# Patient Record
Sex: Female | Born: 1985 | Race: White | Hispanic: No | Smoking: Former smoker
Health system: Southern US, Community
[De-identification: ages and names within clinical notes are randomized; demographics above are authoritative.]

## PROBLEM LIST (undated history)

## (undated) DIAGNOSIS — Z800 Family history of malignant neoplasm of digestive organs: Secondary | ICD-10-CM

## (undated) DIAGNOSIS — Z803 Family history of malignant neoplasm of breast: Secondary | ICD-10-CM

## (undated) HISTORY — DX: Family history of malignant neoplasm of digestive organs: Z80.0

## (undated) HISTORY — DX: Family history of malignant neoplasm of breast: Z80.3

---

## 2011-08-05 HISTORY — PX: TUBAL LIGATION: SHX77

## 2013-12-02 ENCOUNTER — Emergency Department (HOSPITAL_COMMUNITY)
Admission: EM | Admit: 2013-12-02 | Discharge: 2013-12-02 | Disposition: A | Discharge status: Home / Self Care | Payer: Medicaid Other | Attending: Emergency Medicine | Admitting: Emergency Medicine

## 2013-12-02 ENCOUNTER — Encounter (HOSPITAL_COMMUNITY): Payer: Self-pay | Admitting: Emergency Medicine

## 2013-12-02 DIAGNOSIS — Z880 Allergy status to penicillin: Secondary | ICD-10-CM | POA: Insufficient documentation

## 2013-12-02 DIAGNOSIS — Z3202 Encounter for pregnancy test, result negative: Secondary | ICD-10-CM | POA: Insufficient documentation

## 2013-12-02 DIAGNOSIS — N390 Urinary tract infection, site not specified: Secondary | ICD-10-CM

## 2013-12-02 DIAGNOSIS — Z87891 Personal history of nicotine dependence: Secondary | ICD-10-CM | POA: Insufficient documentation

## 2013-12-02 DIAGNOSIS — Z79899 Other long term (current) drug therapy: Secondary | ICD-10-CM | POA: Insufficient documentation

## 2013-12-02 LAB — URINALYSIS, ROUTINE W REFLEX MICROSCOPIC
BILIRUBIN URINE: NEGATIVE
GLUCOSE, UA: NEGATIVE mg/dL
Ketones, ur: NEGATIVE mg/dL
Nitrite: NEGATIVE
Protein, ur: 100 mg/dL — AB
Specific Gravity, Urine: 1.021 (ref 1.005–1.030)
Urobilinogen, UA: 0.2 mg/dL (ref 0.0–1.0)
pH: 6 (ref 5.0–8.0)

## 2013-12-02 LAB — POC URINE PREG, ED: PREG TEST UR: NEGATIVE

## 2013-12-02 LAB — URINE MICROSCOPIC-ADD ON

## 2013-12-02 MED ORDER — PHENAZOPYRIDINE HCL 200 MG PO TABS: 200.0000 mg | ORAL_TABLET | Freq: Three times a day (TID) | ORAL | Status: DC

## 2013-12-02 MED ORDER — SULFAMETHOXAZOLE-TMP DS 800-160 MG PO TABS
1.0000 | ORAL_TABLET | Freq: Two times a day (BID) | ORAL | Status: DC
  Administered 2013-12-02: 1 via ORAL
  Filled 2013-12-02: qty 1

## 2013-12-02 MED ORDER — PHENAZOPYRIDINE HCL 200 MG PO TABS
200.0000 mg | ORAL_TABLET | Freq: Three times a day (TID) | ORAL | Status: DC
  Administered 2013-12-02: 200 mg via ORAL
  Filled 2013-12-02: qty 1

## 2013-12-02 MED ORDER — SULFAMETHOXAZOLE-TMP DS 800-160 MG PO TABS: 1.0000 | ORAL_TABLET | Freq: Two times a day (BID) | ORAL | Status: DC

## 2013-12-02 NOTE — Discharge Instructions (Signed)
Urinary Tract Infection  Urinary tract infections (UTIs) can develop anywhere along your urinary tract. Your urinary tract is your body's drainage system for removing wastes and extra water. Your urinary tract includes two kidneys, two ureters, a bladder, and a urethra. Your kidneys are a pair of bean-shaped organs. Each kidney is about the size of your fist. They are located below your ribs, one on each side of your spine.  CAUSES  Infections are caused by microbes, which are microscopic organisms, including fungi, viruses, and bacteria. These organisms are so small that they can only be seen through a microscope. Bacteria are the microbes that most commonly cause UTIs.  SYMPTOMS   Symptoms of UTIs may vary by age and gender of the patient and by the location of the infection. Symptoms in young women typically include a frequent and intense urge to urinate and a painful, burning feeling in the bladder or urethra during urination. Older women and men are more likely to be tired, shaky, and weak and have muscle aches and abdominal pain. A fever may mean the infection is in your kidneys. Other symptoms of a kidney infection include pain in your back or sides below the ribs, nausea, and vomiting.  DIAGNOSIS  To diagnose a UTI, your caregiver will ask you about your symptoms. Your caregiver also will ask to provide a urine sample. The urine sample will be tested for bacteria and white blood cells. White blood cells are made by your body to help fight infection.  TREATMENT   Typically, UTIs can be treated with medication. Because most UTIs are caused by a bacterial infection, they usually can be treated with the use of antibiotics. The choice of antibiotic and length of treatment depend on your symptoms and the type of bacteria causing your infection.  HOME CARE INSTRUCTIONS   If you were prescribed antibiotics, take them exactly as your caregiver instructs you. Finish the medication even if you feel better after you  have only taken some of the medication.   Drink enough water and fluids to keep your urine clear or pale yellow.   Avoid caffeine, tea, and carbonated beverages. They tend to irritate your bladder.   Empty your bladder often. Avoid holding urine for long periods of time.   Empty your bladder before and after sexual intercourse.   After a bowel movement, women should cleanse from front to back. Use each tissue only once.  SEEK MEDICAL CARE IF:    You have back pain.   You develop a fever.   Your symptoms do not begin to resolve within 3 days.  SEEK IMMEDIATE MEDICAL CARE IF:    You have severe back pain or lower abdominal pain.   You develop chills.   You have nausea or vomiting.   You have continued burning or discomfort with urination.  MAKE SURE YOU:    Understand these instructions.   Will watch your condition.   Will get help right away if you are not doing well or get worse.  Document Released: 04/30/2005 Document Revised: 01/20/2012 Document Reviewed: 08/29/2011  [employer] Patient Information 2014 [employer].

## 2013-12-02 NOTE — Progress Notes (Signed)
[facility] provided pt with a list of primary care resources and a [facility] [employer] Card application to help patient establish primary care.  °

## 2013-12-02 NOTE — ED Notes (Signed)
Pt reports to ed with c/o  bilateral lower abdominal pain since yesterday morning.Pt also reports urinary frequncy and pain with urination.

## 2013-12-02 NOTE — ED Provider Notes (Signed)
CSN: [id number]     Arrival date & time 12/02/13  1508 History   First MD Initiated Contact with Patient 12/02/13 1549     Chief Complaint  Patient presents with  . Abdominal Pain   Patient is a 28 y.o. female presenting with dysuria.  Dysuria Pain quality:  Sharp Pain severity:  Moderate Duration:  1 day Timing:  Constant Progression:  Worsening Chronicity:  New Relieved by:  Nothing Exacerbated by: urinating. Urinary symptoms: discolored urine, frequent urination and hematuria   Associated symptoms: abdominal pain   Associated symptoms: no fever, no genital lesions, no vaginal discharge and no vomiting   Risk factors: no hx of pyelonephritis   Pt states there is a family history of kidney stones in several family members.  History reviewed. No pertinent past medical history. History reviewed. No pertinent past surgical history. No family history on file. History  Substance Use Topics  . Smoking status: Former Smoker    Quit date: 08/04/2008  . Smokeless tobacco: Not on file  . Alcohol Use: Yes     Comment: occ.   OB History   Grav Para Term Preterm Abortions TAB SAB Ect Mult Living                 Review of Systems  Constitutional: Negative for fever.  Gastrointestinal: Positive for abdominal pain. Negative for vomiting.  Genitourinary: Positive for dysuria. Negative for vaginal discharge.  All other systems reviewed and are negative.     Allergies  Penicillins  Home Medications   Prior to Admission medications   Medication Sig Start Date End Date Taking? Authorizing Provider  calcium gluconate 500 MG tablet Take 1 tablet by mouth daily.   Yes Historical Provider, MD  omega-3 acid ethyl esters (LOVAZA) 1 G capsule Take 1 g by mouth daily.   Yes Historical Provider, MD   BP 129/76  Pulse 89  Temp(Src) 97.6 F (36.4 C) (Oral)  Resp 18  SpO2 100%  LMP 11/18/2013 Physical Exam  Nursing note and vitals reviewed. Constitutional: She appears well-developed  and well-nourished. No distress.  HENT:  Head: Normocephalic and atraumatic.  Right Ear: External ear normal.  Left Ear: External ear normal.  Eyes: Conjunctivae are normal. Right eye exhibits no discharge. Left eye exhibits no discharge. No scleral icterus.  Neck: Neck supple. No tracheal deviation present.  Cardiovascular: Normal rate, regular rhythm and intact distal pulses.   Pulmonary/Chest: Effort normal and breath sounds normal. No stridor. No respiratory distress. She has no wheezes. She has no rales.  Abdominal: Soft. Bowel sounds are normal. She exhibits no distension. There is no tenderness. There is no rebound and no guarding.  Musculoskeletal: She exhibits no edema and no tenderness.  Neurological: She is alert. She has normal strength. No cranial nerve deficit (no facial droop, extraocular movements intact, no slurred speech) or sensory deficit. She exhibits normal muscle tone. She displays no seizure activity. Coordination normal.  Skin: Skin is warm and dry. No rash noted.  Psychiatric: She has a normal mood and affect.    ED Course  Procedures (including critical care time) Labs Review Labs Reviewed  URINALYSIS, ROUTINE W REFLEX MICROSCOPIC - Abnormal; Notable for the following:    APPearance TURBID (*)    Hgb urine dipstick LARGE (*)    Protein, ur 100 (*)    Leukocytes, UA LARGE (*)    All other components within normal limits  URINE CULTURE  URINE MICROSCOPIC-ADD ON  POC URINE PREG, ED  MDM   Final diagnoses:  UTI (urinary tract infection)    Simple uti.  Will dc home with pyridium and bactrim.  No fever.  No signs of pyelo.  Will send off urine culture    Celene KrasJon R Jaidy Cottam, MD 12/02/13 1725

## 2013-12-03 LAB — URINE CULTURE

## 2014-02-18 ENCOUNTER — Emergency Department (HOSPITAL_COMMUNITY)
Admission: EM | Admit: 2014-02-18 | Discharge: 2014-02-19 | Disposition: A | Discharge status: Home / Self Care | Payer: Medicaid Other | Attending: Emergency Medicine | Admitting: Emergency Medicine

## 2014-02-18 ENCOUNTER — Encounter (HOSPITAL_COMMUNITY): Payer: Self-pay | Admitting: Emergency Medicine

## 2014-02-18 DIAGNOSIS — Y9289 Other specified places as the place of occurrence of the external cause: Secondary | ICD-10-CM | POA: Insufficient documentation

## 2014-02-18 DIAGNOSIS — Z87891 Personal history of nicotine dependence: Secondary | ICD-10-CM | POA: Insufficient documentation

## 2014-02-18 DIAGNOSIS — T1590XA Foreign body on external eye, part unspecified, unspecified eye, initial encounter: Secondary | ICD-10-CM | POA: Diagnosis not present

## 2014-02-18 DIAGNOSIS — Z880 Allergy status to penicillin: Secondary | ICD-10-CM | POA: Diagnosis not present

## 2014-02-18 DIAGNOSIS — Y9389 Activity, other specified: Secondary | ICD-10-CM | POA: Diagnosis not present

## 2014-02-18 DIAGNOSIS — T1592XA Foreign body on external eye, part unspecified, left eye, initial encounter: Secondary | ICD-10-CM

## 2014-02-18 MED ORDER — ERYTHROMYCIN 5 MG/GM OP OINT
TOPICAL_OINTMENT | Freq: Once | OPHTHALMIC | Status: DC
  Filled 2014-02-18: qty 3.5

## 2014-02-18 MED ORDER — TETRACAINE HCL 0.5 % OP SOLN: 2.0000 [drp] | Freq: Once | OPHTHALMIC | Status: DC

## 2014-02-18 NOTE — ED Notes (Signed)
Pt arrives via EMS with c/o of superglue in L eye from doing fingernails. States it was scraping her eye when she looked around, no redness, no leakage, not visible to EMS.

## 2014-02-19 MED ORDER — ERYTHROMYCIN 5 MG/GM OP OINT
1.0000 "application " | TOPICAL_OINTMENT | Freq: Once | OPHTHALMIC | Status: AC
  Administered 2014-02-19: 1 via OPHTHALMIC

## 2014-02-19 NOTE — ED Notes (Signed)
EDP at bedside  

## 2014-02-19 NOTE — ED Provider Notes (Signed)
CSN: [id number]     Arrival date & time 02/18/14  2305 History   First MD Initiated Contact with Patient 02/18/14 2346     Chief Complaint  Patient presents with  . Foreign Body in Eye     (Consider location/radiation/quality/duration/timing/severity/associated sxs/prior Treatment) HPI Comments: The patient presents by ambulance after stating that she got glue in her left eye. She was applying acrylic nails, decided that she wanted a different length and when she popped off one of her nails fresh adhesive flew into her left eye. This caused acute onset of mild discomfort and a feeling of a foreign body sensation. She denies any discharge redness watering or pain and has no change in vision. She states that she feels as though she has something under her eyelid. This occurred just prior to arrival, acute in onset, persistent, mild  Patient is a 28 y.o. female presenting with foreign body in eye. The history is provided by the patient.  Foreign Body in Eye    History reviewed. No pertinent past medical history. Past Surgical History  Procedure Laterality Date  . Cesarean section     No family history on file. History  Substance Use Topics  . Smoking status: Former Smoker    Quit date: 08/04/2008  . Smokeless tobacco: Not on file  . Alcohol Use: Yes     Comment: occ.   OB History   Grav Para Term Preterm Abortions TAB SAB Ect Mult Living                 Review of Systems  Eyes: Positive for pain. Negative for photophobia, discharge, redness and visual disturbance.  Gastrointestinal: Negative for nausea and vomiting.      Allergies  Penicillins  Home Medications   Prior to Admission medications   Not on File   BP 106/53  Pulse 70  Temp(Src) 98 F (36.7 C) (Oral)  Resp 14  Ht 5\' 6"  (1.676 m)  Wt 170 lb (77.111 kg)  BMI 27.45 kg/m2  SpO2 98%  LMP 02/17/2014 Physical Exam  Constitutional: She appears well-developed and well-nourished. No distress.  HENT:   Head: Normocephalic and atraumatic.  Mouth/Throat: No oropharyngeal exudate.  Eyes: Conjunctivae and EOM are normal. Pupils are equal, round, and reactive to light. Right eye exhibits no discharge. Left eye exhibits no discharge. No scleral icterus.  I examined under tetracaine and fluorescein, Woods lamp, no signs of foreign body, no corneal abrasion, lid everted without signs of foreign body, no crusting discharge or watering  Pulmonary/Chest: Effort normal.  Skin: Skin is warm and dry. No rash noted. No erythema.    ED Course  Procedures (including critical care time) Labs Review Labs Reviewed - No data to display  Imaging Review No results found.    MDM   Final diagnoses:  Foreign body, eye, left, initial encounter    No signs of foreign body or corneal injury on my exam, due to her feeling of foreign body sensation we'll discharge home with erythromycin ointment, instructions to followup with ophthalmology, phone number given, patient expresses understanding to the plan   Meds given in ED:  Medications  tetracaine (PONTOCAINE) 0.5 % ophthalmic solution 2 drop (not administered)  erythromycin ophthalmic ointment 1 application (not administered)    New Prescriptions   No medications on file        Vida RollerBrian D Tashiba Timoney, MD 02/19/14 [phone]

## 2014-02-19 NOTE — Discharge Instructions (Signed)
Followup with the eye doctor on Monday, use the antibiotic ointment every 3 hours until followup. 1/2 inch of ointment, leave it in the eye for 5-10 minutes blinking frequently. If she develops severe or worsening redness, discharge, pain or swelling return to the hospital immediately.

## 2014-03-24 ENCOUNTER — Encounter: Payer: Self-pay | Admitting: Family Medicine

## 2014-03-24 ENCOUNTER — Ambulatory Visit: Payer: Medicaid Other | Attending: Family Medicine | Admitting: Family Medicine

## 2014-03-24 ENCOUNTER — Other Ambulatory Visit (HOSPITAL_COMMUNITY)
Admission: RE | Admit: 2014-03-24 | Discharge: 2014-03-24 | Disposition: A | Discharge status: Home / Self Care | Payer: Medicaid Other | Source: Ambulatory Visit | Attending: Family Medicine | Admitting: Family Medicine

## 2014-03-24 VITALS — BP 105/70 | HR 93 | Temp 98.1°F | Resp 16 | Ht 67.0 in | Wt 184.0 lb

## 2014-03-24 DIAGNOSIS — Z803 Family history of malignant neoplasm of breast: Secondary | ICD-10-CM | POA: Diagnosis not present

## 2014-03-24 DIAGNOSIS — Z809 Family history of malignant neoplasm, unspecified: Secondary | ICD-10-CM

## 2014-03-24 DIAGNOSIS — Z113 Encounter for screening for infections with a predominantly sexual mode of transmission: Secondary | ICD-10-CM

## 2014-03-24 DIAGNOSIS — Z87891 Personal history of nicotine dependence: Secondary | ICD-10-CM | POA: Insufficient documentation

## 2014-03-24 DIAGNOSIS — Z01419 Encounter for gynecological examination (general) (routine) without abnormal findings: Secondary | ICD-10-CM | POA: Diagnosis present

## 2014-03-24 DIAGNOSIS — E663 Overweight: Secondary | ICD-10-CM | POA: Diagnosis not present

## 2014-03-24 DIAGNOSIS — H6123 Impacted cerumen, bilateral: Secondary | ICD-10-CM | POA: Insufficient documentation

## 2014-03-24 DIAGNOSIS — M79609 Pain in unspecified limb: Secondary | ICD-10-CM | POA: Insufficient documentation

## 2014-03-24 DIAGNOSIS — M7989 Other specified soft tissue disorders: Secondary | ICD-10-CM

## 2014-03-24 DIAGNOSIS — Z800 Family history of malignant neoplasm of digestive organs: Secondary | ICD-10-CM | POA: Diagnosis not present

## 2014-03-24 DIAGNOSIS — N760 Acute vaginitis: Secondary | ICD-10-CM | POA: Diagnosis present

## 2014-03-24 DIAGNOSIS — H6120 Impacted cerumen, unspecified ear: Secondary | ICD-10-CM

## 2014-03-24 DIAGNOSIS — Z124 Encounter for screening for malignant neoplasm of cervix: Secondary | ICD-10-CM

## 2014-03-24 LAB — CBC
HCT: 41 % (ref 36.0–46.0)
Hemoglobin: 13.4 g/dL (ref 12.0–15.0)
MCH: 25.9 pg — AB (ref 26.0–34.0)
MCHC: 32.7 g/dL (ref 30.0–36.0)
MCV: 79.3 fL (ref 78.0–100.0)
Platelets: 303 10*3/uL (ref 150–400)
RBC: 5.17 MIL/uL — AB (ref 3.87–5.11)
RDW: 15.4 % (ref 11.5–15.5)
WBC: 5.7 10*3/uL (ref 4.0–10.5)

## 2014-03-24 MED ORDER — CARBAMIDE PEROXIDE 6.5 % OT SOLN: 5.0000 [drp] | Freq: Two times a day (BID) | OTIC | Status: DC

## 2014-03-24 NOTE — Progress Notes (Signed)
   Subjective:    Patient ID: Suzanne Campbell, female    DOB: 12/31/1985, 28 y.o.   MRN: [id number] CC: establish care with pap, family history of cancer, L arm pain and swelling  HPI 28 year old female presents to establish care and discuss the following:  #1 family history of cancer: Patient has a strong family history of multiple cancers including gastric cancer, breast cancer, colon cancer. Her family history of colon cancer notable for colon cancer her mother diagnosed at age 27. Her mother died an early age from complications of colon cancer. Patient has never had a colonoscopy. Patient [place] plan [place] before testing was done. Patient has no personal history of cancer.  #2 left biceps pain with swelling: Patient woke up 4 days ago with pain in her left by separating out her left arm medially and laterally. Patient was also had pain in her left pectoral laterally. The following day the patient noted bruising in her left bicep. She denies left arm weakness. She denies lack of grip strength. Pain is moderate severity. She has had no treatment. The patient has no history of DVT.  #3 healthcare maintenance: Patient is due for Pap smear. Patient essentially active with one partner. She has tubal ligation for contraception. She requests STD testing.   Social history: Patient is a former smoker.  Review of Systems As per history of present illness    Objective:   Physical Exam BP 105/70  Pulse 93  Temp(Src) 98.1 F (36.7 C) (Oral)  Resp 16  Ht 5\' 7"  (1.702 m)  Wt 184 lb (83.462 kg)  BMI 28.81 kg/m2  SpO2 97%  LMP 03/13/2014 General appearance: alert, cooperative and no distress Ears: cerumen in both ears partially occluding canals  Nose: no discharge, turbinates pink, swollen Throat: lips, mucosa, and tongue normal; teeth and gums normal Neck: no adenopathy, supple, symmetrical, trachea midline and thyroid not enlarged, symmetric,  no tenderness/mass/nodules Lungs: clear to auscultation bilaterally Breasts: normal appearance, no masses or tenderness Heart: regular rate and rhythm, S1, S2 normal, no murmur, click, rub or gallop Pelvic: cervix normal in appearance, external genitalia normal, no adnexal masses or tenderness, no cervical motion tenderness, rectovaginal septum normal, uterus normal size, shape, and consistency and vagina normal without discharge Extremities: L arm: tenderness with notable linear bruise along bicep tendon down to lateral forearms. normal muschle tone. normal grip strenght.  no LE edema. Varicose vein R lateral thigh distally        Assessment & Plan:

## 2014-03-24 NOTE — Progress Notes (Signed)
Pt is here to establish care. Pt is here for a physical and pap smear. Pt states that her left arm is in pain and bruised.

## 2014-03-24 NOTE — Assessment & Plan Note (Signed)
A: Pain and swelling left bicep concerning for DVT versus partial biceps tendon rupture. Patient denies history of trauma. Patient denies heavy lifting. There is no evidence of soft tissue infection. P:  Evaluate with Doppler left upper extremity to rule out DVT. Tylenol and ice for pain.

## 2014-03-24 NOTE — Patient Instructions (Signed)
Ms. Carolin CoyRoach,  It was very nice to meet you. I look forward to caring for you and being your primary doctor.  I will be in touch with lab and pap results.  For L arm pain and swelling: please go for ultrasound to rule out blood clot. Please take Tylenol and use ice for pain.  For your history of colon cancer in her mother: I will look up recommendations are for he to GI specialist to discuss colonoscopy recommended.  For your strong family history of multiple cancers as placed referral to a geneticist.  Dr. Armen PickupFunches

## 2014-03-24 NOTE — Assessment & Plan Note (Signed)
A: history concern for genetic predisposition to cancer. Sisters living w/o cancer.  P:  Based on history, patient should have had a colonoscopy at 19.  Plan to send to GI to discuss risk and benefit of screening colonoscopy.

## 2014-03-24 NOTE — Assessment & Plan Note (Signed)
Screening HIV  

## 2014-03-25 LAB — COMPREHENSIVE METABOLIC PANEL
ALBUMIN: 4.8 g/dL (ref 3.5–5.2)
ALK PHOS: 60 U/L (ref 39–117)
ALT: 12 U/L (ref 0–35)
AST: 17 U/L (ref 0–37)
BUN: 13 mg/dL (ref 6–23)
CO2: 28 meq/L (ref 19–32)
Calcium: 9.6 mg/dL (ref 8.4–10.5)
Chloride: 100 mEq/L (ref 96–112)
Creat: 0.79 mg/dL (ref 0.50–1.10)
GLUCOSE: 85 mg/dL (ref 70–99)
POTASSIUM: 3.9 meq/L (ref 3.5–5.3)
Sodium: 139 mEq/L (ref 135–145)
Total Bilirubin: 0.4 mg/dL (ref 0.2–1.2)
Total Protein: 7.5 g/dL (ref 6.0–8.3)

## 2014-03-25 LAB — HIV ANTIBODY (ROUTINE TESTING W REFLEX): HIV: NONREACTIVE

## 2014-03-27 LAB — CYTOLOGY - PAP

## 2014-03-30 ENCOUNTER — Telehealth: Payer: Self-pay | Admitting: Family Medicine

## 2014-03-30 ENCOUNTER — Telehealth: Payer: Self-pay | Admitting: *Deleted

## 2014-03-30 NOTE — Telephone Encounter (Signed)
Left message on VM to return my call. 

## 2014-03-30 NOTE — Telephone Encounter (Signed)
Pt returning nurse's call, please f/u with pt.   °

## 2014-03-30 NOTE — Telephone Encounter (Signed)
Message copied by Raynelle Chary on Thu Mar 30, 2014 10:17 AM ------      Message from: Dessa Phi      Created: Mon Mar 27, 2014 12:42 PM       Normal pap repeat in 3 year.      Negative STD testing.       Normal CBC and CMP. ------

## 2014-04-03 ENCOUNTER — Telehealth: Payer: Self-pay | Admitting: *Deleted

## 2014-04-03 NOTE — Telephone Encounter (Signed)
Pt is aware of her results.  

## 2014-04-03 NOTE — Telephone Encounter (Signed)
Pt returning call regarding results.  

## 2014-04-07 ENCOUNTER — Ambulatory Visit (HOSPITAL_COMMUNITY)
Admission: RE | Admit: 2014-04-07 | Discharge: 2014-04-07 | Disposition: A | Discharge status: Home / Self Care | Payer: Medicaid Other | Source: Ambulatory Visit | Attending: Vascular Surgery | Admitting: Vascular Surgery

## 2014-04-07 DIAGNOSIS — M7989 Other specified soft tissue disorders: Secondary | ICD-10-CM

## 2014-04-07 NOTE — Progress Notes (Signed)
Left upper extremity venous duplex completed:  No evidence of DVT or superficial thrombosis.    

## 2014-04-12 ENCOUNTER — Telehealth: Payer: Self-pay | Admitting: *Deleted

## 2014-04-12 NOTE — Telephone Encounter (Signed)
Message copied by Raynelle Chary on Wed Apr 12, 2014 12:54 PM ------      Message from: Dessa Phi      Created: Mon Mar 27, 2014  4:51 PM       Normal pap repeat in 3 year.      Negative STD testing.       Normal CBC and CMP ------

## 2014-04-12 NOTE — Telephone Encounter (Signed)
Pt is aware of her lab and pap results. 

## 2014-06-05 ENCOUNTER — Encounter: Payer: Self-pay | Admitting: Family Medicine

## 2014-07-17 ENCOUNTER — Emergency Department (HOSPITAL_COMMUNITY)
Admission: EM | Admit: 2014-07-17 | Discharge: 2014-07-17 | Disposition: A | Discharge status: Home / Self Care | Payer: Medicaid Other | Attending: Emergency Medicine | Admitting: Emergency Medicine

## 2014-07-17 ENCOUNTER — Encounter (HOSPITAL_COMMUNITY): Payer: Self-pay | Admitting: Emergency Medicine

## 2014-07-17 DIAGNOSIS — Z87891 Personal history of nicotine dependence: Secondary | ICD-10-CM | POA: Diagnosis not present

## 2014-07-17 DIAGNOSIS — Z79899 Other long term (current) drug therapy: Secondary | ICD-10-CM | POA: Insufficient documentation

## 2014-07-17 DIAGNOSIS — Z880 Allergy status to penicillin: Secondary | ICD-10-CM | POA: Diagnosis not present

## 2014-07-17 DIAGNOSIS — T368X5A Adverse effect of other systemic antibiotics, initial encounter: Secondary | ICD-10-CM | POA: Insufficient documentation

## 2014-07-17 DIAGNOSIS — Z0000 Encounter for general adult medical examination without abnormal findings: Secondary | ICD-10-CM | POA: Insufficient documentation

## 2014-07-17 DIAGNOSIS — T50905A Adverse effect of unspecified drugs, medicaments and biological substances, initial encounter: Secondary | ICD-10-CM

## 2014-07-17 DIAGNOSIS — Z008 Encounter for other general examination: Secondary | ICD-10-CM

## 2014-07-17 DIAGNOSIS — R220 Localized swelling, mass and lump, head: Secondary | ICD-10-CM | POA: Diagnosis present

## 2014-07-17 MED ORDER — PREDNISONE 20 MG PO TABS: 40.0000 mg | ORAL_TABLET | Freq: Every day | ORAL | Status: DC

## 2014-07-17 MED ORDER — PREDNISONE 20 MG PO TABS
60.0000 mg | ORAL_TABLET | Freq: Once | ORAL | Status: AC
  Administered 2014-07-17: 60 mg via ORAL
  Filled 2014-07-17: qty 3

## 2014-07-17 NOTE — ED Notes (Addendum)
Pt states she may be having a reaction to an antibiotic clindamycin she started taking yesterday. States yesterday her eyes were swollen and her lips swollen. Went to sleep woke up with bumps and swelling to lips but eyes have went down.

## 2014-07-17 NOTE — Discharge Instructions (Signed)
If the symptoms do not improve with prednisone, you may try over the counter ABREVA.  Drug Allergy Allergic reactions to medicines are common. Some allergic reactions are mild. A delayed type of drug allergy that occurs 1 week or more after exposure to a medicine or vaccine is called serum sickness. A life-threatening, sudden (acute) allergic reaction that involves the whole body is called anaphylaxis. CAUSES  "True" drug allergies occur when there is an allergic reaction to a medicine. This is caused by overactivity of the immune system. First, the body becomes sensitized. The immune system is triggered by your first exposure to the medicine. Following this first exposure, future exposure to the same medicine may be life-threatening. Almost any medicine can cause an allergic reaction. Common ones are:  Penicillin.  Sulfonamides (sulfa drugs).  Local anesthetics.  X-ray dyes that contain iodine. SYMPTOMS  Common symptoms of a minor allergic reaction are:  Swelling around the mouth.  An itchy red rash or hives.  Vomiting or diarrhea. Anaphylaxis can cause swelling of the mouth and throat. This makes it difficult to breathe and swallow. Severe reactions can be fatal within seconds, even after exposure to only a trace amount of the drug that causes the reaction. HOME CARE INSTRUCTIONS   If you are unsure of what caused your reaction, keep a diary of foods and medicines used. Include the symptoms that followed. Avoid anything that causes reactions.  You may want to follow up with an allergy specialist after the reaction has cleared in order to be tested to confirm the allergy. It is important to confirm that your reaction is an allergy, not just a side effect to the medicine. If you have a true allergy to a medicine, this may prevent that medicine and related medicines from being given to you when you are very ill.  If you have hives or a rash:  Take medicines as directed by your  caregiver.  You may use an over-the-counter antihistamine (diphenhydramine) as needed.  Apply cold compresses to the skin or take baths in cool water. Avoid hot baths or showers.  If you are severely allergic:  Continuous observation after a severe reaction may be needed. Hospitalization is often required.  Wear a medical alert bracelet or necklace stating your allergy.  You and your family must learn how to use an anaphylaxis kit or give an epinephrine injection to temporarily treat an emergency allergic reaction. If you have had a severe reaction, always carry your epinephrine injection or anaphylaxis kit with you. This can be lifesaving if you have a severe reaction.  Do not drive or perform tasks after treatment until the medicines used to treat your reaction have worn off, or until your caregiver says it is okay. SEEK MEDICAL CARE IF:   You think you had an allergic reaction. Symptoms usually start within 30 minutes after exposure.  Symptoms are getting worse rather than better.  You develop new symptoms.  The symptoms that brought you to your caregiver return. SEEK IMMEDIATE MEDICAL CARE IF:   You have swelling of the mouth, difficulty breathing, or wheezing.  You have a tight feeling in your chest or throat.  You develop hives, swelling, or itching all over your body.  You develop severe vomiting or diarrhea.  You feel faint or pass out. This is an emergency. Use your epinephrine injection or anaphylaxis kit as you have been instructed. Call for emergency medical help. Even if you improve after the injection, you need to be examined  at a hospital emergency department. MAKE SURE YOU:   Understand these instructions.  Will watch your condition.  Will get help right away if you are not doing well or get worse. Document Released: 07/21/2005 Document Revised: 10/13/2011 Document Reviewed: 12/25/2010 [facility] Patient Information 2015 [place], [place]. This information is  not intended to replace advice given to you by your health care provider. Make sure you discuss any questions you have with your health care provider.

## 2014-07-17 NOTE — ED Provider Notes (Signed)
CSN: [id number]     Arrival date & time 07/17/14  1045 History   First MD Initiated Contact with Patient 07/17/14 1052     Chief Complaint  Patient presents with  . Medication Reaction     (Consider location/radiation/quality/duration/timing/severity/associated sxs/prior Treatment) HPI Comments: Patient with no pertinent past medical history presents to the emergency department with chief complaint of medication reaction. She states that she recently had some dental work done, and was placed on clindamycin. She states that she started taking the clindamycin yesterday. She states that she soon noticed extensive swelling to her cheeks and eyelids and lips. She states that the dentist discontinue the clindamycin and instructed her to take Benadryl. She reports significant relief with Benadryl, but comments that she still has some swelling of the lips as well as some small bumps on the lips. She has not tried any additional therapy. She denies any fevers, chills, nausea, vomiting, diarrhea, shortness of breath, or wheezing.   The history is provided by the patient. No language interpreter was used.    History reviewed. No pertinent past medical history. Past Surgical History  Procedure Laterality Date  . Cesarean section  2007    all 4 children   . Tubal ligation  2013   Family History  Problem Relation Age of Onset  . Cancer Mother 3728    colon  . Cancer Maternal Grandfather 70    stomach   . Cancer Maternal Aunt 30    breast cancer   . Diabetes Neg Hx   . Heart disease Neg Hx   . Hypertension Neg Hx   . Cancer Maternal Aunt     stomach   History  Substance Use Topics  . Smoking status: Former Smoker    Quit date: 08/04/2008  . Smokeless tobacco: Never Used  . Alcohol Use: 0.6 oz/week    1 Glasses of wine per week     Comment: occ.   OB History    Gravida Para Term Preterm AB TAB SAB Ectopic Multiple Living   4 4 4       4      Review of Systems  Constitutional:  Negative for fever and chills.  Respiratory: Negative for shortness of breath.   Cardiovascular: Negative for chest pain.  Gastrointestinal: Negative for nausea, vomiting, diarrhea and constipation.  Genitourinary: Negative for dysuria.  Skin: Positive for rash.  All other systems reviewed and are negative.     Allergies  Penicillins  Home Medications   Prior to Admission medications   Medication Sig Start Date End Date Taking? Authorizing Provider  calcium carbonate (OS-CAL) 600 MG TABS tablet Take 600 mg by mouth daily with breakfast.    Historical Provider, MD  carbamide peroxide (DEBROX) 6.5 % otic solution Place 5 drops into both ears 2 (two) times daily. 03/24/14   Lora PaulaJosalyn C Funches, MD  Multiple Vitamins-Minerals (HAIR/SKIN/NAILS PO) Take 1 tablet by mouth daily.    Historical Provider, MD   BP 130/98 mmHg  Pulse 79  Temp(Src) 98.2 F (36.8 C) (Oral)  Resp 18  SpO2 100%  LMP 07/03/2014 (Exact Date) Physical Exam  Constitutional: She is oriented to person, place, and time. She appears well-developed and well-nourished.  HENT:  Head: Normocephalic and atraumatic.  No evidence of preseptal or periorbital cellulitis, no swelling about the eyelids, mild swelling about the left cheek, significantly less swelling when compared to patient's picture  No tongue swelling, no evidence of Ludwig angina  Eyes: Conjunctivae and EOM are  normal. Pupils are equal, round, and reactive to light.  Normal extraocular eye movement, no evidence of entrapment  Neck: Normal range of motion. Neck supple.  Cardiovascular: Normal rate and regular rhythm.  Exam reveals no gallop and no friction rub.   No murmur heard. Pulmonary/Chest: Effort normal and breath sounds normal. No respiratory distress. She has no wheezes. She has no rales. She exhibits no tenderness.  Clear to auscultation bilaterally  Abdominal: Soft. Bowel sounds are normal. She exhibits no distension and no mass. There is no  tenderness. There is no rebound and no guarding.  Genitourinary:     Musculoskeletal: Normal range of motion. She exhibits no edema or tenderness.  Neurological: She is alert and oriented to person, place, and time.  Skin: Skin is warm and dry.  Psychiatric: She has a normal mood and affect. Her behavior is normal. Judgment and thought content normal.  Nursing note and vitals reviewed.   ED Course  Procedures (including critical care time) Labs Review Labs Reviewed - No data to display  Imaging Review No results found.   EKG Interpretation None      MDM   Final diagnoses:  Encounter for medical assessment  Medication reaction, initial encounter    Patient with medication reaction to clindamycin. Encouraged the patient to follow the dentist's recommendation and discontinue the clindamycin. Encouraged patient to continue taking Benadryl and will also give the patient prednisone. Patient does still have mildly swollen lips with some bumps on the lip that looks somewhat herpetic. However at this time I doubt that this is a cold sore given the amount of swelling noted to the lips and face in the patient's picture from yesterday.  She has had significant relief and appears well now.  I have encouraged the patient that if she has persistent symptoms she can treat with abreva.  Patient understands and agrees with the plan.  She is stable and ready for discharge.   Roxy HorsemanRobert Bayyinah Dukeman, PA-C 07/17/14 1145  Arby BarretteMarcy Pfeiffer, MD 07/18/14 [phone]

## 2015-11-22 ENCOUNTER — Encounter: Payer: Self-pay | Admitting: Family Medicine

## 2015-11-22 ENCOUNTER — Encounter: Payer: Self-pay | Admitting: Gastroenterology

## 2015-11-22 ENCOUNTER — Ambulatory Visit: Payer: Medicaid Other | Attending: Family Medicine | Admitting: Family Medicine

## 2015-11-22 VITALS — BP 118/73 | HR 77 | Temp 98.8°F | Resp 16 | Ht 66.0 in | Wt 183.0 lb

## 2015-11-22 DIAGNOSIS — H6123 Impacted cerumen, bilateral: Secondary | ICD-10-CM | POA: Diagnosis not present

## 2015-11-22 DIAGNOSIS — Z880 Allergy status to penicillin: Secondary | ICD-10-CM | POA: Diagnosis not present

## 2015-11-22 DIAGNOSIS — Z803 Family history of malignant neoplasm of breast: Secondary | ICD-10-CM | POA: Insufficient documentation

## 2015-11-22 DIAGNOSIS — Z0000 Encounter for general adult medical examination without abnormal findings: Secondary | ICD-10-CM

## 2015-11-22 DIAGNOSIS — Z809 Family history of malignant neoplasm, unspecified: Secondary | ICD-10-CM

## 2015-11-22 DIAGNOSIS — Z800 Family history of malignant neoplasm of digestive organs: Secondary | ICD-10-CM

## 2015-11-22 MED ORDER — CARBAMIDE PEROXIDE 6.5 % OT SOLN: 5.0000 [drp] | Freq: Two times a day (BID) | OTIC | Status: DC

## 2015-11-22 NOTE — Patient Instructions (Addendum)
Suzanne Campbell was seen today for annual exam.  Diagnoses and all orders for this visit:  Family history of cancer -     Ambulatory referral to Genetics  Family history of colon cancer in mother -     Ambulatory referral to Gastroenterology   You will be called with appt details  F/u in one year, sooner if needed Next pap sis due in August 2018.   Dr. Armen PickupFunches

## 2015-11-22 NOTE — Progress Notes (Signed)
Annual Physical  No pain today  No tobacco user  No suicidal thoughts in the past two weeks

## 2015-11-22 NOTE — Assessment & Plan Note (Signed)
GI referral placed for colon cancer early screening

## 2015-11-22 NOTE — Assessment & Plan Note (Signed)
Genetics referral placed.

## 2015-11-22 NOTE — Progress Notes (Signed)
SUBJECTIVE:  30 y.o. female for annual routine checkup.   #1 family history of cancer: Patient has a strong family history of multiple cancers including gastric cancer, breast cancer, colon cancer. Her family history of colon cancer notable for colon cancer her mother diagnosed at age 29. Her mother died an early age from complications of colon cancer. Patient has never had a colonoscopy. [place]. I referred her for c-scope and genetic testing 2 years ago and she left town before getting testing done. Patient has no personal history of cancer. Denies breast pain or lump, denies blood in stool.    Current Outpatient Prescriptions  Medication Sig Dispense Refill  . calcium carbonate (OS-CAL) 1250 (500 Ca) MG chewable tablet Chew 1 tablet by mouth daily.    . Multiple Vitamins-Minerals (HAIR/SKIN/NAILS PO) Take 1 tablet by mouth daily.     No current facility-administered medications for this visit.   Allergies: Penicillins  Patient's last menstrual period was 11/15/2015.  ROS:  Feeling well. No dyspnea or chest pain on exertion.  No abdominal pain, change in bowel habits, black or bloody stools.  No urinary tract symptoms. GYN ROS: normal menses, no abnormal bleeding, pelvic pain or discharge, no breast pain or new or enlarging lumps on self exam. No neurological complaints.  OBJECTIVE:  The patient appears well, alert, oriented x 3, in no distress. BP 118/73 mmHg  Pulse 77  Temp(Src) 98.8 F (37.1 C) (Oral)  Resp 16  Ht 5\' 6"  (1.676 m)  Wt 183 lb (83.008 kg)  BMI 29.55 kg/m2  SpO2 99%  LMP 11/15/2015  Wt Readings from Last 3 Encounters:  11/22/15 183 lb (83.008 kg)  03/24/14 184 lb (83.462 kg)  02/18/14 170 lb (77.111 kg)  cerumen in both ears, swollen nasal turbinates..  Neck supple. No adenopathy or thyromegaly. PERLA. Lungs are clear, good air entry, no wheezes, rhonchi or rales. S1  and S2 normal, no murmurs, regular rate and rhythm. Abdomen soft without tenderness, guarding, mass or organomegaly. Extremities show no edema, normal peripheral pulses. Neurological is normal, no focal findings.  BREAST EXAM: breasts appear normal, no suspicious masses, no skin or nipple changes or axillary nodes  PELVIC EXAM: normal external genitalia, vulva, vagina, cervix, uterus and adnexa, examination not indicated  ASSESSMENT:  well woman  PLAN:  additional lab tests per orders

## 2015-11-23 ENCOUNTER — Encounter: Payer: Self-pay | Admitting: Clinical
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
## 2015-11-29 ENCOUNTER — Telehealth: Payer: Self-pay | Admitting: Family Medicine

## 2015-11-29 ENCOUNTER — Encounter: Payer: Self-pay | Admitting: Clinical

## 2015-11-29 ENCOUNTER — Encounter: Payer: Self-pay | Admitting: Physician Assistant

## 2015-11-29 ENCOUNTER — Other Ambulatory Visit (HOSPITAL_COMMUNITY)
Admission: RE | Admit: 2015-11-29 | Discharge: 2015-11-29 | Disposition: A | Discharge status: Home / Self Care | Payer: Medicaid Other | Source: Ambulatory Visit | Attending: Physician Assistant | Admitting: Physician Assistant

## 2015-11-29 ENCOUNTER — Ambulatory Visit: Payer: Medicaid Other | Attending: Physician Assistant | Admitting: Physician Assistant

## 2015-11-29 VITALS — BP 118/76 | HR 84 | Temp 97.8°F | Wt 188.6 lb

## 2015-11-29 DIAGNOSIS — B373 Candidiasis of vulva and vagina: Secondary | ICD-10-CM

## 2015-11-29 DIAGNOSIS — B3731 Acute candidiasis of vulva and vagina: Secondary | ICD-10-CM

## 2015-11-29 DIAGNOSIS — Z113 Encounter for screening for infections with a predominantly sexual mode of transmission: Secondary | ICD-10-CM | POA: Insufficient documentation

## 2015-11-29 DIAGNOSIS — N760 Acute vaginitis: Secondary | ICD-10-CM | POA: Diagnosis present

## 2015-11-29 MED ORDER — FLUCONAZOLE 150 MG PO TABS: 150.0000 mg | ORAL_TABLET | Freq: Once | ORAL | Status: DC

## 2015-11-29 NOTE — Telephone Encounter (Signed)
Pt. Called requesting to speak with the nurse because she was seen today and stated that her PCP did not tell her how to treat her vaginal irritation. Please f/u with pt.

## 2015-11-29 NOTE — Patient Instructions (Signed)
Contraceptive Barrier Methods A barrier method is a type of birth control (contraception) that is used to prevent pregnancy. These methods include:   Female condom.   Female condom.   Diaphragm.   Cervical cap.   Sponge.   Spermicide.  Your health care provider can help you decide what form of contraception is best for you. Always keep in mind the risks of sexually transmitted infections (STIs).  FEMALE CONDOM A female condom is a thin sheath (latex or rubber) that is worn over the penis during sexual intercourse. The condom prevents pregnancy by catching and stopping the sperm from reaching the uterus. Condoms may come with a spermicide on them, and they can only be worn once. Condoms should not be used with petroleum jelly, lotions, or oils. These things decrease their effectiveness. Condoms can be used with water-based lubricants. Condoms help protect against STIs. Latex and polyurethane condoms provide the best available protection against many STIs, including HIV.  FEMALE CONDOM The female condom is a soft, loose-fitting sheath that is put into the vagina before sexual intercourse. It prevents pregnancy by catching the sperm in the condom and blocking the passage of sperm to the uterus. It is intended for one-time use only. A female partner should not use a condom at the same time. The female and female condoms may stick together and break. A female condom can be inserted as long as 8 hours before intercourse. Condoms help protect against STIs.  DIAPHRAGM A diaphragm is a soft, latex, dome-shaped barrier that is placed in the vagina with spermicidal jelly before sexual intercourse. It covers the cervix, kills sperm, and blocks the passage of semen into the cervix. The diaphragm can be inserted up to 2 hours before sex. If it is inserted more than 2 hours before intercourse, then spermicide must be applied again. The diaphragm should be left in the vagina for 6-8 hours after intercourse. It must  be fitted by a health care provider. This method does not protect against STIs.  CERVICAL CAP A cervical cap is a round, soft, latex or plastic cup that is put in the vagina and fits over the cervix. It may be inserted as long as 6 hours before sexual activity. It must be left in place for at least 6 hours after intercourse and can be left in place for as long as 48 hours. It provides continuous protection as long as it is in place, regardless of the number of intercourse acts. The cervical cap cannot be used during your period. It must be fitted by a health care provider. Cervical caps do not protect against STIs. SPONGE A sponge is a soft, circular piece of polyurethane foam that has spermicide in it. The sponge has a loop for removal. It is inserted into the vagina after wetting it and is placed over the cervix before sexual intercourse. The foam is designed to trap and absorb sperm before it enters the cervix. The spermicide kills or immobilizes sperm. The sponge offers an immediate and continuous presence of spermicide throughout a 24-hour period regardless of the number of intercourse acts. The sponge should be left in place for at least 6 hours after sex. It should not be left in for more than 24 hours, and it cannot be reused. The sponge does not protect against STIs. SPERMICIDES Spermicides are chemicals that kill or block sperm from entering the cervix and uterus. They come in the form of creams, jellies, suppositories, foam, film, or tablets. The film, tablets, and   suppositories should be inserted 10 to 30 minutes before sexual intercourse so they can dissolve. They are inserted into the vagina with an applicator before having sexual intercourse. This must be repeated every time you have sexual intercourse. The use of spermicides does not protect against STIs.   This information is not intended to replace advice given to you by your health care provider. Make sure you discuss any questions you  have with your health care provider.   Document Released: 05/18/2007 Document Revised: 03/23/2013 Document Reviewed: 01/02/2013 Elsevier Interactive Patient Education 2016 [employer] Inc[employer]  

## 2015-11-29 NOTE — Telephone Encounter (Signed)
Clld pt  - Per Provider, advsd pt to only take OTC Benadryl for vaginal irritation until lab results are received due to possible allergic reaction to latex condoms instead of vaginal infection. Pt is not to use any type of OTC Hydrocortisone creams in that area due to inflammation because less is better for now. Rx for diflucan was prescribed in case infection is resulted. Pt stated she understood provider's instructions and have no other questions.

## 2015-11-29 NOTE — Progress Notes (Signed)
Patient ID: [name], female   DOB: March 20, 1986, 30 y.o.   MRN: [id number]   Suzanne Campbell, is a 30 y.o. female  JXB:[id number]  [id number]:[id number]  DOB - 13-Oct-1985  Chief Complaint  Patient presents with  . Vaginal Discharge    white no odor x 3dys        Subjective:  Chief Complaint and HPI: Suzanne Campbell is a 30 y.o. female here today for a 3 day history of irritation in the vulva.  The irritation, burning, and itching began after intercourse with a new partner and them using latex condoms for protection.  The irritation started immediately/within minutes after intercourse.  Prior to this experience she has only had one sex partner since age 66 and that was her husband.  She does remember early on when they were having sex that she reacted to latex condoms a few times.  She hasn't used condoms in many years.  +BTL.  Recent separation from husband and has started dating.  This is her first/only new partner.  She c/o whitish vaginal d/c.  No pelvic pain.  No dysuria.  She admits to taking a bath after IC and scrubbed herself rigorously with a sea salt scrub.  No pap in 2 years and isn't due until next year.   ROS:   Constitutional:  No f/c, No night sweats, No unexplained weight loss. EENT:  No vision changes, No blurry vision, No hearing changes. No mouth, throat, or ear problems.  Respiratory: No cough, No SOB Cardiac: No CP, no palpitations GI:  No abd pain, No N/V/D. GU: No Urinary s/sx Musculoskeletal: No joint pain Neuro: No headache, no dizziness, no motor weakness.  Skin: No rash Endocrine:  No polydipsia. No polyuria.  Psych: Denies SI/HI   ALLERGIES: Allergies  Allergen Reactions  . Penicillins Hives    PAST MEDICAL HISTORY: No past medical history on file.  MEDICATIONS AT HOME: Prior to Admission medications   Medication Sig Start Date End Date Taking? Authorizing Provider  Calcium Citrate-Vitamin D (CALCIUM + D PO) Take by mouth 2 (two) times daily.   Yes Historical  Provider, MD  Multiple Vitamins-Minerals (HAIR/SKIN/NAILS PO) Take 1 tablet by mouth daily.   Yes Historical Provider, MD  Omega-3 Fatty Acids (OMEGA 3 PO) Take by mouth daily.   Yes Historical Provider, MD  calcium carbonate (OS-CAL) 1250 (500 Ca) MG chewable tablet Chew 1 tablet by mouth daily. Reported on 11/29/2015    Historical Provider, MD  carbamide peroxide (DEBROX) 6.5 % otic solution Place 5 drops into both ears 2 (two) times daily. 11/22/15   Josalyn Funches, MD  chlorhexidine (PERIDEX) 0.12 % solution 5 mLs by Mouth Rinse route See admin instructions. Gargle twice a day after breakfast and dinner. 11/06/15   Historical Provider, MD  fluconazole (DIFLUCAN) 150 MG tablet Take 1 tablet (150 mg total) by mouth once. 11/29/15   Anders Simmonds, PA-C     Objective:  EXAM:   Filed Vitals:   11/29/15 1131  BP: 118/76  Pulse: 84  Temp: 97.8 F (36.6 C)  TempSrc: Oral  Weight: 188 lb 9.6 oz (85.548 kg)    General appearance : A&OX3. NAD. Non-toxic-appearing HEENT: Atraumatic and Normocephalic.  PERRLA. EOM intact.  TM clear B. Mouth-MMM, post pharynx WNL w/o erythema, No PND. Neck: supple, no JVD. No cervical lymphadenopathy. No thyromegaly Chest/Lungs:  Breathing-non-labored, Good air entry bilaterally, breath sounds normal without rales, rhonchi, or wheezing  CVS: S1 S2 regular, no murmurs, gallops, rubs  Abdomen: Bowel sounds present, Non tender and not distended with no gaurding, rigidity or rebound. Genitalia:  Introitus and vulva with mild selling and TTP without any evidence of lesion.  Speculum inserted reveals whitish and clumpy d/c.  Cervix and vaginal mucose Appears irritated but benign.  GC/Chlam probe and wet prep taken.  Bimanual exam unremarkable.  Extremities: Bilateral Lower Ext shows no edema, both legs are warm to touch with = pulse throughout Neurology:  CN II-XII grossly intact, Non focal.   Psych:  TP linear. J/I WNL. Normal speech. Appropriate eye contact and  affect.  Skin:  No Rash  Data Review No results found for: HGBA1C   Assessment & Plan   1. Yeast vaginitis GC/CHLAM/WP taken - fluconazole (DIFLUCAN) 150 MG tablet; Take 1 tablet (150 mg total) by mouth once.  Dispense: 1 tablet; Refill: 0.  Covering for yeast due to appearance of clumpy discharge;  However, this may all be a latex/condom hypersensitivity reaction or that she scrubbed herself too rigorously with a salt scrub after the event.  We discussed non-latex barrier methods and that they do have slightly less effectiveness at STD prevention.  Safe sex practices discussed at length.  Spent ~45 mins face to face with 50% spent on counseling.  - Cervicovaginal ancillary only  Patient have been counseled extensively about nutrition and exercise  Return in about 1 year (around 11/28/2016).; sooner if needed  The patient was given clear instructions to go to ER or return to medical center if symptoms don't improve, worsen or new problems develop. The patient verbalized understanding. The patient was told to call to get lab results if they haven't heard anything in the next week.   Georgian CoAngela Verlon Carcione, PA-C [facility] [place], [place] [phone]   11/29/2015, 1:53 PM

## 2015-11-29 NOTE — Progress Notes (Signed)
[facility] 11/29/2015 11/22/2015 03/24/2014  Decreased Interest 0 0 0  Down, Depressed, Hopeless 0 1 0  PHQ - 2 Score 0 1 0  Altered sleeping 1 0 -  Tired, decreased energy 0 0 -  Change in appetite 1 1 -  Feeling bad or failure about yourself  0 0 -  Trouble concentrating 0 1 -  Moving slowly or fidgety/restless 0 0 -  Suicidal thoughts 0 0 -  PHQ-9 Score 2 3 -    GAD 7 : Generalized Anxiety Score 11/29/2015 11/22/2015  Nervous, Anxious, on Edge 1 2  Control/stop worrying 1 2  Worry too much - different things 0 2  Trouble relaxing 0 2  Restless 0 2  Easily annoyed or irritable 0 0  Afraid - awful might happen 1 2  Total GAD 7 Score 3 12  Anxiety Difficulty Not difficult at all -

## 2015-11-30 ENCOUNTER — Emergency Department (HOSPITAL_COMMUNITY)
Admission: EM | Admit: 2015-11-30 | Discharge: 2015-11-30 | Disposition: A | Discharge status: Home / Self Care | Payer: No Typology Code available for payment source | Attending: Emergency Medicine | Admitting: Emergency Medicine

## 2015-11-30 ENCOUNTER — Encounter (HOSPITAL_COMMUNITY): Payer: Self-pay | Admitting: Emergency Medicine

## 2015-11-30 DIAGNOSIS — Z880 Allergy status to penicillin: Secondary | ICD-10-CM | POA: Diagnosis not present

## 2015-11-30 DIAGNOSIS — Y92410 Unspecified street and highway as the place of occurrence of the external cause: Secondary | ICD-10-CM | POA: Diagnosis not present

## 2015-11-30 DIAGNOSIS — F419 Anxiety disorder, unspecified: Secondary | ICD-10-CM | POA: Insufficient documentation

## 2015-11-30 DIAGNOSIS — S4991XA Unspecified injury of right shoulder and upper arm, initial encounter: Secondary | ICD-10-CM | POA: Diagnosis not present

## 2015-11-30 DIAGNOSIS — Y9389 Activity, other specified: Secondary | ICD-10-CM | POA: Insufficient documentation

## 2015-11-30 DIAGNOSIS — Y998 Other external cause status: Secondary | ICD-10-CM | POA: Insufficient documentation

## 2015-11-30 DIAGNOSIS — Z87891 Personal history of nicotine dependence: Secondary | ICD-10-CM | POA: Insufficient documentation

## 2015-11-30 DIAGNOSIS — Z79899 Other long term (current) drug therapy: Secondary | ICD-10-CM | POA: Diagnosis not present

## 2015-11-30 DIAGNOSIS — S4992XA Unspecified injury of left shoulder and upper arm, initial encounter: Secondary | ICD-10-CM | POA: Diagnosis not present

## 2015-11-30 LAB — CERVICOVAGINAL ANCILLARY ONLY
Chlamydia: NEGATIVE
Neisseria Gonorrhea: NEGATIVE
WET PREP (BD AFFIRM): POSITIVE — AB
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
## 2015-11-30 NOTE — Discharge Instructions (Signed)
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
## 2015-12-03 ENCOUNTER — Encounter (HOSPITAL_COMMUNITY): Payer: Self-pay | Admitting: *Deleted

## 2015-12-03 ENCOUNTER — Ambulatory Visit (HOSPITAL_COMMUNITY)
Admission: EM | Admit: 2015-12-03 | Discharge: 2015-12-03 | Disposition: A | Discharge status: Home / Self Care | Payer: No Typology Code available for payment source | Attending: Family Medicine | Admitting: Family Medicine

## 2015-12-03 DIAGNOSIS — R51 Headache: Secondary | ICD-10-CM

## 2015-12-03 MED ORDER — TRAMADOL HCL 50 MG PO TABS: 50.0000 mg | ORAL_TABLET | Freq: Four times a day (QID) | ORAL | Status: DC | PRN

## 2015-12-03 MED ORDER — HYDROCODONE-ACETAMINOPHEN 5-325 MG PO TABS: 1.0000 | ORAL_TABLET | Freq: Four times a day (QID) | ORAL | Status: DC | PRN

## 2015-12-03 NOTE — ED Notes (Signed)
Pt  Reports  Symptoms  Of          Headache    Continued   Symptoms    Seen in   Er       4  Days  Ago        Continues  To  Have  Symptoms
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
## 2015-12-03 NOTE — Discharge Instructions (Signed)

## 2015-12-07 ENCOUNTER — Encounter (HOSPITAL_COMMUNITY): Payer: Self-pay

## 2015-12-07 ENCOUNTER — Ambulatory Visit (HOSPITAL_COMMUNITY)
Admission: EM | Admit: 2015-12-07 | Discharge: 2015-12-07 | Disposition: A | Discharge status: Home / Self Care | Payer: Medicaid Other | Attending: Emergency Medicine | Admitting: Emergency Medicine

## 2015-12-07 ENCOUNTER — Ambulatory Visit: Payer: Medicaid Other | Admitting: Family Medicine

## 2015-12-07 ENCOUNTER — Telehealth: Payer: Self-pay | Admitting: Family Medicine

## 2015-12-07 DIAGNOSIS — N898 Other specified noninflammatory disorders of vagina: Secondary | ICD-10-CM | POA: Diagnosis not present

## 2015-12-07 DIAGNOSIS — Z880 Allergy status to penicillin: Secondary | ICD-10-CM | POA: Insufficient documentation

## 2015-12-07 DIAGNOSIS — Z87891 Personal history of nicotine dependence: Secondary | ICD-10-CM | POA: Diagnosis not present

## 2015-12-07 MED ORDER — TERCONAZOLE 0.8 % VA CREA: 1.0000 | TOPICAL_CREAM | Freq: Every day | VAGINAL | Status: DC

## 2015-12-07 MED ORDER — FLUCONAZOLE 150 MG PO TABS: 150.0000 mg | ORAL_TABLET | Freq: Once | ORAL | Status: DC

## 2015-12-07 NOTE — ED Provider Notes (Signed)
CSN: [id number]     Arrival date & time 12/07/15  1304 History   First MD Initiated Contact with Patient 12/07/15 1321     Chief Complaint  Patient presents with  . Vaginitis   (Consider location/radiation/quality/duration/timing/severity/associated sxs/prior Treatment) HPI  She is a 30 year old woman here for evaluation of vaginal discharge. She was seen by her OB provider about a week ago with vaginal irritation and discharge. At that time she had been using a new soap as well as had a new sexual partner and used condoms for the first time. She had STD testing done that was negative. She did come back positive for yeast. She was treated with a single dose of Diflucan. She states her symptoms did improve temporarily, but have worsened again. She reports more discharge. She also reports some irritation and itching. No odor. No new sexual partners in the last week.  She has stopped using her new soap as well. No fevers or abdominal pain. No dysuria, urinary frequency, or urgency.  History reviewed. No pertinent past medical history. Past Surgical History  Procedure Laterality Date  . Cesarean section  2007    all 4 children   . Tubal ligation  2013   Family History  Problem Relation Age of Onset  . Cancer Mother 3628    colon  . Cancer Maternal Grandfather 70    stomach   . Cancer Maternal Aunt 30    breast cancer   . Diabetes Neg Hx   . Heart disease Neg Hx   . Hypertension Neg Hx   . Cancer Maternal Aunt     stomach  . Cancer Maternal Aunt     breast    Social History  Substance Use Topics  . Smoking status: Former Smoker    Quit date: 08/04/2008  . Smokeless tobacco: Never Used  . Alcohol Use: 0.6 oz/week    1 Glasses of wine per week     Comment: occ.   OB History    Gravida Para Term Preterm AB TAB SAB Ectopic Multiple Living   4 4 4       4      Review of Systems As in history of present illness Allergies  Penicillins  Home Medications   Prior to Admission  medications   Medication Sig Start Date End Date Taking? Authorizing Provider  calcium carbonate (OS-CAL) 1250 (500 Ca) MG chewable tablet Chew 1 tablet by mouth daily. Reported on 11/29/2015   Yes Historical Provider, MD  Calcium Citrate-Vitamin D (CALCIUM + D PO) Take by mouth 2 (two) times daily.   Yes Historical Provider, MD  chlorhexidine (PERIDEX) 0.12 % solution 5 mLs by Mouth Rinse route See admin instructions. Gargle twice a day after breakfast and dinner. 11/06/15  Yes Historical Provider, MD  Omega-3 Fatty Acids (OMEGA 3 PO) Take by mouth daily.   Yes Historical Provider, MD  carbamide peroxide (DEBROX) 6.5 % otic solution Place 5 drops into both ears 2 (two) times daily. 11/22/15   Josalyn Funches, MD  fluconazole (DIFLUCAN) 150 MG tablet Take 1 tablet (150 mg total) by mouth once. Take 2nd pill if symptoms not completely resolved in 3 days. 12/07/15   Charm RingsErin J Zamari Bonsall, MD  Multiple Vitamins-Minerals (HAIR/SKIN/NAILS PO) Take 1 tablet by mouth daily.    Historical Provider, MD  terconazole (TERAZOL 3) 0.8 % vaginal cream Place 1 applicator vaginally at bedtime. For 5 days 12/07/15   Charm RingsErin J Jendayi Berling, MD  traMADol (ULTRAM) 50 MG tablet Take  1 tablet (50 mg total) by mouth every 6 (six) hours as needed for moderate pain or severe pain. 12/03/15   Abram Sander, MD   Meds Ordered and Administered this Visit  Medications - No data to display  BP 125/91 mmHg  Pulse 93  Temp(Src) 98.8 F (37.1 C) (Oral)  Resp 18  SpO2 100%  LMP 11/15/2015 (Exact Date) No data found.   Physical Exam  Constitutional: She is oriented to person, place, and time. She appears well-developed and well-nourished. No distress.  Cardiovascular: Normal rate.   Pulmonary/Chest: Effort normal.  Genitourinary: Cervix exhibits no motion tenderness. No bleeding in the vagina. No foreign body around the vagina. No signs of injury around the vagina. Vaginal discharge (clumpy white; no odor) found.  Neurological: She is alert and  oriented to person, place, and time.    ED Course  Procedures (including critical care time)  Labs Review Labs Reviewed  CERVICOVAGINAL ANCILLARY ONLY    Imaging Review No results found.    MDM   1. Vaginal discharge    History and exam points to recurrent yeast infection. No need to repeat STD testing as this was done a week ago. Wet prep sent. We'll treat presumptively with Diflucan and terconazole cream. Follow-up as needed.    Charm Rings, MD 12/07/15 [phone]

## 2015-12-07 NOTE — Discharge Instructions (Signed)
It looks like you still have some yeast. Take Diflucan one pill today and the second pill in 3 days. Use the terconazole cream once a day for the next 5 days. The swab will take 2-3 days to come back. Follow-up as needed.
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
## 2015-12-07 NOTE — Telephone Encounter (Signed)
Patient would like to be advised regarding headaches....patient states that the medication prescribed is not helping.Suzanne Campbell.Suzanne Campbell.Suzanne Campbell.Suzanne Campbell.please follow up
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
## 2015-12-11 LAB — CERVICOVAGINAL ANCILLARY ONLY: WET PREP (BD AFFIRM): NEGATIVE

## 2015-12-12 ENCOUNTER — Encounter: Payer: Self-pay | Admitting: Genetic Counselor

## 2015-12-12 ENCOUNTER — Ambulatory Visit (HOSPITAL_BASED_OUTPATIENT_CLINIC_OR_DEPARTMENT_OTHER): Payer: Medicaid Other | Admitting: Genetic Counselor

## 2015-12-12 ENCOUNTER — Other Ambulatory Visit: Payer: Medicaid Other

## 2015-12-12 DIAGNOSIS — Z315 Encounter for genetic counseling: Secondary | ICD-10-CM | POA: Diagnosis not present

## 2015-12-12 DIAGNOSIS — Z800 Family history of malignant neoplasm of digestive organs: Secondary | ICD-10-CM

## 2015-12-12 DIAGNOSIS — Z803 Family history of malignant neoplasm of breast: Secondary | ICD-10-CM

## 2015-12-12 NOTE — Progress Notes (Signed)
REFERRING PROVIDER: Boykin Nearing, MD [address] [place], [place] [address]  PRIMARY PROVIDER:  Minerva Ends, MD  PRIMARY REASON FOR VISIT:  1. Family history of colon cancer in mother   2. Family history of breast cancer   3. Family history of stomach cancer      HISTORY OF PRESENT ILLNESS:   Ms. Avallone, a 30 y.o. female, was seen for a [facility] cancer genetics consultation at the request of Dr. Adrian Blackwater due to a family history of cancer.  Ms. Adamson presents to clinic today to discuss the possibility of a hereditary predisposition to cancer, genetic testing, and to further clarify her future cancer risks, as well as potential cancer risks for family members.  Ms. Warwick is a 30 y.o. female with no personal history of cancer.  She reports that she has an appointment at [facility] to talk with a provider about getting a colonoscopy.  This is scheduled in June.  She also reports that she thinks her maternal aunt had genetic testing when she was first diagnosed with breast cancer about 10 years ago.  She thinks that she tested positive for "BRCA2 or BRCA3".  Ms. Puskas is not aware of anyone else in the family who has had genetic testing and does not know if she can get a copy of the genetic test results.  CANCER HISTORY:   No history exists.     HORMONAL RISK FACTORS:  Menarche was at age 45.  First live birth at age 20.  OCP use for approximately 0 years.  Ovaries intact: yes.  Hysterectomy: no.  Menopausal status: premenopausal.  HRT use: 0 years. Colonoscopy: no; scheduled for an appointment in June. Mammogram within the last year: no. Number of breast biopsies: 0. Up to date with pelvic exams:  yes. Any excessive radiation exposure in the past:  no  Past Medical History  Diagnosis Date  . Family history of colon cancer   . Family history of breast cancer   . Family history of stomach cancer     Past Surgical History  Procedure Laterality Date  . Cesarean  section  2007    all 4 children   . Tubal ligation  2013    Social History   Social History  . Marital Status: Legally Separated    Spouse Name: N/A  . Number of Children: 4  . Years of Education: N/A   Social History Main Topics  . Smoking status: Former Smoker    Quit date: 08/04/2008  . Smokeless tobacco: Never Used  . Alcohol Use: 0.6 oz/week    1 Glasses of wine per week     Comment: occ.  . Drug Use: No  . Sexual Activity: Yes    Birth Control/ Protection: Surgical, Condom   Other Topics Concern  . None   Social History Narrative     FAMILY HISTORY:  We obtained a detailed, 4-generation family history.  Significant diagnoses are listed below: Family History  Problem Relation Age of Onset  . Colon cancer Mother 23  . Stomach cancer Maternal Grandfather     dx in his 38s-60s  . Breast cancer Maternal Aunt 30    possible BRCA testing  . Diabetes Neg Hx   . Heart disease Neg Hx   . Hypertension Neg Hx   . Breast cancer Maternal Aunt 42    The patient has four children who are cancer free. She has two full sisters and five paternal half siblings -  two sisters and three brothers.  The patient's mother was diagnosed around age 5-28 with colon cancer and died the following year.  Her mother had three sisters and one brother.  One sister was diagnosed with breast cancer at age 67, and had genetic testing that may have shown a BRCA mutation.  This sister recently passed away.  A second sister was also diagnosed with breast cancer at age 38.  The maternal grandfather was diagnosed with stomach cancer in his 30s-60s.  The patient's father is 39 and healthy.  He as five sisters and three brothers.  There is no reported family history of cancer on his side of the family.  Patient's maternal ancestors are of African American descent, and paternal ancestors are of African American descent. There is no reported Ashkenazi Jewish ancestry. There is no known consanguinity.  GENETIC  COUNSELING ASSESSMENT: CHYLER CREELY is a 30 y.o. female with a family history of colon, breast and stomach cancer, and a possible BRCA mutation which is somewhat suggestive of a hereditary cancer syndrome and predisposition to cancer. We, therefore, discussed and recommended the following at today's visit.   DISCUSSION: We discussed that about 5-10% of breast cancer is due to hereditary mutations, most commonly due to BRCA mutations.  We discussed that if her aunt had a BRCA mutation, then the patient is at increased risk for having the same mutation. That risk is approximately 25%.  However, colon cancer, the cancer found in the patient's mother, is not a typical cancer seen in BRCA families.  The young age of onset of this cancer suggests that there could be a hereditary colon cancer syndrome, such as Lynch syndrome, FAP or MYH-associated polyposis.  Families with Lynch syndrome are at increased risk for both stomach and breast cancer.  Additionally, sometimes individuals with both breast and stomach cancer in the family can have CDH1 mutations.  We reviewed the characteristics, features and inheritance patterns of hereditary cancer syndromes. We also discussed genetic testing, including the appropriate family members to test, the process of testing, insurance coverage and turn-around-time for results. We discussed the implications of a negative, positive and/or variant of uncertain significant result. We recommended Ms. Colonna pursue genetic testing for the custom gene panel that includes all the genes in the comprehensive cancer panel, plus additional genes that can be associated with stomach cancer including SDHA, SDHB, SDHC, and SDHD.  The Comprehensive Cancer Panel offered by [facility] includes sequencing and/or deletion duplication testing of the following 32 genes: APC, ATM, AXIN2, BARD1, BMPR1A, BRCA1, BRCA2, BRIP1, CDH1, CDK4, CDKN2A, CHEK2, EPCAM, FANCC, MLH1, MSH2, MSH6, MUTYH, NBN, PALB2, PMS2, POLD1,  POLE, PTEN, RAD51C, RAD51D, SCG5/GREM1, SDHA, SDHB, SDHC, SDHD, SMAD4, STK11, TP53, VHL, and XRCC2.     Based on Ms. Gillum's family history of cancer, she meets medical criteria for genetic testing. Despite that she meets criteria, she may still have an out of pocket cost. We discussed that if her out of pocket cost for testing is over $100, the laboratory will call and confirm whether she wants to proceed with testing.  If the out of pocket cost of testing is less than $100 she will be billed by the genetic testing laboratory.   PLAN: After considering the risks, benefits, and limitations, Ms. Egle  provided informed consent to pursue genetic testing and the blood sample was sent to [facility] for analysis of the Custom gene panel. Results should be available within approximately 2-3 weeks' time, at which point they will  be disclosed by telephone to Ms. Paden, as will any additional recommendations warranted by these results. Ms. Lewey will receive a summary of her genetic counseling visit and a copy of her results once available. This information will also be available in Epic. We encouraged Ms. Betters to remain in contact with cancer genetics annually so that we can continuously update the family history and inform her of any changes in cancer genetics and testing that may be of benefit for her family. Ms. Puett questions were answered to her satisfaction today. Our contact information was provided should additional questions or concerns arise.  Lastly, we encouraged Ms. Kinnamon to remain in contact with cancer genetics annually so that we can continuously update the family history and inform her of any changes in cancer genetics and testing that may be of benefit for this family.   Ms.  Dulski questions were answered to her satisfaction today. Our contact information was provided should additional questions or concerns arise. Thank you for the referral and allowing [place] to share in the care of  your patient.   Karen P. Florene Glen, [place], [facility] Certified Genetic Counselor [email or web address] phone: [phone]  The patient was seen for a total of 50 minutes in face-to-face genetic counseling.  This patient was discussed with Drs. Magrinat, Lindi Adie and/or Burr Medico who agrees with the above.    _______________________________________________________________________ For Office Staff:  Number of people involved in session: 1 Was an Intern/ student involved with case: yes: Elouise Munroe

## 2015-12-14 NOTE — Telephone Encounter (Signed)
Pt stated HA resolved. Tramadol help  Advised to reschedule F/U appointment with PCP

## 2015-12-28 ENCOUNTER — Telehealth: Payer: Self-pay | Admitting: Genetic Counselor

## 2015-12-28 ENCOUNTER — Encounter: Payer: Self-pay | Admitting: Genetic Counselor

## 2015-12-28 DIAGNOSIS — Z1379 Encounter for other screening for genetic and chromosomal anomalies: Secondary | ICD-10-CM | POA: Insufficient documentation

## 2015-12-28 NOTE — Telephone Encounter (Signed)
Reported that patient had negative genetic testing.  We do not know why her mother had colon cancer at 53 and she also reported that an aunt had a BRCA mutation.  We do not see mutations that would explain an early colon cancer and she was negative for BRCA mutations.  She is trying to obtain a copy of her aunts BRCA report, but her cousin is in the [employer] and is not able to get a copy until he is back.  She understands that our recommendation is to be screened for colon cancer based on her family history.

## 2016-01-03 ENCOUNTER — Ambulatory Visit: Payer: Self-pay | Admitting: Genetic Counselor

## 2016-01-03 DIAGNOSIS — Z800 Family history of malignant neoplasm of digestive organs: Secondary | ICD-10-CM

## 2016-01-03 DIAGNOSIS — Z803 Family history of malignant neoplasm of breast: Secondary | ICD-10-CM

## 2016-01-03 DIAGNOSIS — Z1379 Encounter for other screening for genetic and chromosomal anomalies: Secondary | ICD-10-CM

## 2016-01-03 NOTE — Progress Notes (Signed)
HPI: Ms. Mackowiak was previously seen in the [facility] due to a family history of cancer and concerns regarding a hereditary predisposition to cancer. Please refer to our prior cancer genetics clinic note for more information regarding Ms. Tomassetti's medical, social and family histories, and our assessment and recommendations, at the time. Ms. Harewood recent genetic test results were disclosed to her, as were recommendations warranted by these results. These results and recommendations are discussed in more detail below.  FAMILY HISTORY:  We obtained a detailed, 4-generation family history.  Significant diagnoses are listed below: Family History  Problem Relation Age of Onset  . Colon cancer Mother 71  . Stomach cancer Maternal Grandfather     dx in his 24s-60s  . Breast cancer Maternal Aunt 30    possible BRCA testing  . Diabetes Neg Hx   . Heart disease Neg Hx   . Hypertension Neg Hx   . Breast cancer Maternal Aunt 42    The patient has four children who are cancer free. She has two full sisters and five paternal half siblings - two sisters and three brothers. The patient's mother was diagnosed around age 56-28 with colon cancer and died the following year. Her mother had three sisters and one brother. One sister was diagnosed with breast cancer at age 54, and had genetic testing that may have shown a BRCA mutation. This sister recently passed away. A second sister was also diagnosed with breast cancer at age 14. The maternal grandfather was diagnosed with stomach cancer in his 55s-60s. The patient's father is 62 and healthy. He as five sisters and three brothers. There is no reported family history of cancer on his side of the family. Patient's maternal ancestors are of African American descent, and paternal ancestors are of African American descent. There is no reported Ashkenazi Jewish ancestry. There is no known consanguinity.  GENETIC TEST RESULTS: At the time  of Ms. Kauzlarich's visit, we recommended she pursue genetic testing of a custom gene panel. The Custom Cancer Panel offered by [facility] includes sequencing and/or deletion duplication testing of the following 36 genes: APC, ATM, AXIN2, BARD1, BMPR1A, BRCA1, BRCA2, BRIP1, CDH1, CDK4, CDKN2A, CHEK2, EPCAM, FANCC, MLH1, MSH2, MSH6, MUTYH, NBN, PALB2, PMS2, POLD1, POLE, PTEN, RAD51C, RAD51D, SCG5/GREM1, SDHA, SDHB, SDHC, SDHD, SMAD4, STK11, TP53, VHL, and XRCC2.     The report date is Dec 27, 2015.  Genetic testing was normal, and did not reveal a deleterious mutation in these genes. The test report has been scanned into EPIC and is located under the Molecular Pathology section of the Results Review tab.   We discussed with Ms. Martin that since the current genetic testing is not perfect, it is possible there may be a gene mutation in one of these genes that current testing cannot detect, but that chance is small. We also discussed, that it is possible that another gene that has not yet been discovered, or that we have not yet tested, is responsible for the cancer diagnoses in the family, and it is, therefore, important to remain in touch with cancer genetics in the future so that we can continue to offer Ms. Viera the most up to date genetic testing.   CANCER SCREENING RECOMMENDATIONS:  Given Ms. Andreatta's personal and family histories, we must interpret these negative results with some caution.  Families with features suggestive of hereditary risk for cancer tend to have multiple family members with cancer, diagnoses in multiple generations and diagnoses before the  age of 4. Ms. Santalucia family exhibits some of these features. Thus this result may simply reflect our current inability to detect all mutations within these genes or there may be a different gene that has not yet been discovered or tested.   We discussed that genetic testing is more informative when testing an individual with cancer.  Therefore, if her  maternal aunt who is still living, and had breast cancer, would be willing to undergo genetic testing we could learn more. Additionally, at the time of the original testing, Ms. Sand indicated that a maternal aunt, who has died, had testing and she thinks had a BRCA mutation.  She has reached out to this aunt's children to obtain a copy.  Ms. Kuehne reports that her cousin is currently in the [employer] and will provide her a copy when he comes back from deployment.  This negative genetic test simply tells [place] that we cannot yet define why Ms. Wetherby has a mother who had colon cancer at 74. Ms. Casad medical management and screening should be based on the prospect that she  may be at an increased risk for colorectal cancer in the future and should, therefore, undergo colonoscopy screening at intervals determined by her GI providers.    RECOMMENDATIONS FOR FAMILY MEMBERS: Women in this family might be at some increased risk of developing cancer, over the general population risk, simply due to the family history of cancer and the possibility of a BRCA mutation in Ms. Wray's maternal aunt. We recommended women in this family have a yearly mammogram beginning at age 59, or 75 years younger than the earliest onset of cancer, an an annual clinical breast exam, and perform monthly breast self-exams. Women in this family should also have a gynecological exam as recommended by their primary provider. All family members should have a colonoscopy by age 30, or possibly earlier based on the family history.  Based on Ms. Quackenbush's family history, we recommended her maternal aunt, who was diagnosed with breast cancer at age 44, have genetic counseling and testing. Ms. Hoopes will let us know if we can be of any assistance in coordinating genetic counseling and/or testing for this family member.   FOLLOW-UP: Lastly, we discussed with Ms. Masek that cancer genetics is a rapidly advancing field and it is possible that new  genetic tests will be appropriate for her and/or her family members in the future. We encouraged her to remain in contact with cancer genetics on an annual basis so we can update her personal and family histories and let her know of advances in cancer genetics that may benefit this family.   Our contact number was provided. Ms. Schmiesing questions were answered to her satisfaction, and she knows she is welcome to call us at anytime with additional questions or concerns.   Roma Kayser, MS, [facility] Certified Genetic Counselor [email or web address]

## 2016-01-08 ENCOUNTER — Encounter (HOSPITAL_COMMUNITY): Payer: Self-pay

## 2016-01-22 ENCOUNTER — Encounter: Payer: Self-pay | Admitting: Gastroenterology

## 2016-01-22 ENCOUNTER — Ambulatory Visit (INDEPENDENT_AMBULATORY_CARE_PROVIDER_SITE_OTHER): Payer: Medicaid Other | Admitting: Gastroenterology

## 2016-01-22 VITALS — BP 96/50 | HR 88 | Ht 63.75 in | Wt 196.0 lb

## 2016-01-22 DIAGNOSIS — Z800 Family history of malignant neoplasm of digestive organs: Secondary | ICD-10-CM | POA: Diagnosis not present

## 2016-01-22 DIAGNOSIS — R1033 Periumbilical pain: Secondary | ICD-10-CM

## 2016-01-22 MED ORDER — NA SULFATE-K SULFATE-MG SULF 17.5-3.13-1.6 GM/177ML PO SOLN: 1.0000 | Freq: Once | ORAL | Status: DC

## 2016-01-22 NOTE — Patient Instructions (Signed)
You have been scheduled for a colonoscopy. Please follow written instructions given to you at your visit today.  Please pick up your prep supplies at the pharmacy within the next 1-3 days. If you use inhalers (even only as needed), please bring them with you on the day of your procedure. Your physician has requested that you go to www.startemmi.com and enter the access code given to you at your visit today. This web site gives a general overview about your procedure. However, you should still follow specific instructions given to you by our office regarding your preparation for the procedure.  If you are age 765 or older, your body mass index should be between 23-30. Your Body mass index is 33.92 kg/(m^2). If this is out of the aforementioned range listed, please consider follow up with your Primary Care Provider.  If you are age 29 or younger, your body mass index should be between 19-25. Your Body mass index is 33.92 kg/(m^2). If this is out of the aformentioned range listed, please consider follow up with your Primary Care Provider.   Thank you for choosing [facility]  Dr Amada JupiterHenry Danis III

## 2016-01-22 NOTE — Progress Notes (Signed)
[facility] Consult Note:  History: Suzanne Campbell 01/22/2016  Referring physician: Minerva Ends, MD  Reason for consult/chief complaint: Family  Hx Of Colon Cancer   Subjective HPI:  This patient was referred by the genetic counselor clinic to consider colonoscopy. She has multiple family members with cancer various types, most notably her mother died from colon cancer which was diagnosed at about age 29. Suzanne Campbell denies constipation rectal bleeding nausea or vomiting. For the last couple of years she has had 1 or 2 episodes a week of acute sharp crampy abdominal pain. It is difficult to characterize, as she says she has not taken much notice of where it hurts or whether or not is related to meals. It is not associated with vomiting or bowel movement, and there's been no hematemesis or rectal bleeding. She's had no testing for these symptoms. Pain usually lasts about 30-60 minutes and then resolves.   ROS:  Review of Systems  Constitutional: Negative for appetite change and unexpected weight change.  HENT: Negative for mouth sores and voice change.   Eyes: Negative for pain and redness.  Respiratory: Negative for cough and shortness of breath.   Cardiovascular: Negative for chest pain and palpitations.  Genitourinary: Negative for dysuria and hematuria.  Musculoskeletal: Negative for myalgias and arthralgias.  Skin: Negative for pallor and rash.  Neurological: Negative for weakness and headaches.  Hematological: Negative for adenopathy.     Past Medical History: Past Medical History  Diagnosis Date  . Family history of colon cancer   . Family history of breast cancer   . Family history of stomach cancer      Past Surgical History: Past Surgical History  Procedure Laterality Date  . Cesarean section  2007    x 4 children   . Tubal ligation  2013     Family History: Family History  Problem Relation Age of Onset  . Colon cancer Mother 48  . Stomach  cancer Maternal Grandfather     dx in his 47s-60s  . Breast cancer Maternal Aunt 30    possible BRCA testing  . Diabetes Neg Hx   . Heart disease Neg Hx   . Hypertension Neg Hx   . Breast cancer Maternal Aunt 42    Social History: Social History   Social History  . Marital Status: Legally Separated    Spouse Name: N/A  . Number of Children: 4  . Years of Education: N/A   Social History Main Topics  . Smoking status: Former Smoker    Types: Cigarettes    Quit date: 08/04/2008  . Smokeless tobacco: Never Used  . Alcohol Use: 0.6 oz/week    1 Glasses of wine per week     Comment: occ.  . Drug Use: No  . Sexual Activity: Yes    Birth Control/ Protection: Surgical, Condom   Other Topics Concern  . None   Social History Narrative    Allergies: Allergies  Allergen Reactions  . Penicillins Hives    Outpatient Meds: Current Outpatient Prescriptions  Medication Sig Dispense Refill  . Multiple Vitamins-Minerals (HAIR/SKIN/NAILS PO) Take 1 tablet by mouth daily.    . Na Sulfate-K Sulfate-Mg Sulf 17.5-3.13-1.6 GM/180ML SOLN Take 1 kit by mouth once. 354 mL 0   No current facility-administered medications for this visit.      ___________________________________________________________________ Objective  Exam:  BP 96/50 mmHg  Pulse 88  Ht 5' 3.75" (1.619 m)  Wt 196 lb (88.905 kg)  BMI 33.92 kg/m2  LMP 01/08/2016   General: this is a(n) well-appearing young African-American woman   Eyes: sclera anicteric, no redness  ENT: oral mucosa moist without lesions, no cervical or supraclavicular lymphadenopathy, good dentition  CV: RRR without murmur, S1/S2, no JVD, no peripheral edema  Resp: clear to auscultation bilaterally, normal RR and effort noted  GI: soft, no tenderness, with active bowel sounds. No guarding or palpable organomegaly noted.  Skin; warm and dry, no rash or jaundice noted  Neuro: awake, alert and oriented x 3. Normal gross motor function  and fluent speech  Labs: Results of testing from her recent genetic counseling appointment were reviewed. No specific mutations were identified, including those of HNPCC.  Assessment: Encounter Diagnoses  Name Primary?  . Family history of colon cancer in mother Yes  . Periumbilical abdominal pain     The cause of her pain is unclear given its location and that it is not always related to meals. She has no altered bowel habits to indicate IBS. I've asked her to be more where of it so she can characterize the episodes when I see her next. If they sound like possible biliary colic, an ultrasound would be the next step.  Plan:  Colonoscopy due to increased risk of colon cancer given her family history. She is agreeable  The benefits and risks of the planned procedure were described in detail with the patient or (when appropriate) their health care proxy.  Risks were outlined as including, but not limited to, bleeding, infection, perforation, adverse medication reaction leading to cardiac or pulmonary decompensation, or pancreatitis (if ERCP).  The limitation of incomplete mucosal visualization was also discussed.  No guarantees or warranties were given.   Thank you for the courtesy of this consult.  Please call me with any questions or concerns.  Nelida Meuse III  CC: Minerva Ends, MD

## 2016-02-07 ENCOUNTER — Encounter: Payer: Medicaid Other | Admitting: Gastroenterology

## 2016-02-07 ENCOUNTER — Telehealth: Payer: Self-pay | Admitting: Gastroenterology

## 2016-02-07 NOTE — Telephone Encounter (Signed)
This patient did not show for her colonoscopy. Please charge cancellation fee.

## 2016-02-14 NOTE — Telephone Encounter (Signed)
Dr. Myrtie Neitheranis,  This patient was a no show 02/07/16. Do you wish to have the no show fee billed?  Thank you, French Anaracy

## 2016-02-14 NOTE — Telephone Encounter (Signed)
yes

## 2016-03-17 ENCOUNTER — Other Ambulatory Visit (HOSPITAL_COMMUNITY)
Admission: RE | Admit: 2016-03-17 | Discharge: 2016-03-17 | Disposition: A | Discharge status: Home / Self Care | Payer: Medicaid Other | Source: Ambulatory Visit | Attending: Internal Medicine | Admitting: Internal Medicine

## 2016-03-17 ENCOUNTER — Ambulatory Visit: Payer: Medicaid Other | Attending: Internal Medicine | Admitting: Physician Assistant

## 2016-03-17 VITALS — BP 108/74 | HR 79 | Temp 98.5°F | Wt 192.4 lb

## 2016-03-17 DIAGNOSIS — Z113 Encounter for screening for infections with a predominantly sexual mode of transmission: Secondary | ICD-10-CM | POA: Insufficient documentation

## 2016-03-17 DIAGNOSIS — N760 Acute vaginitis: Secondary | ICD-10-CM | POA: Insufficient documentation

## 2016-03-17 DIAGNOSIS — N898 Other specified noninflammatory disorders of vagina: Secondary | ICD-10-CM

## 2016-03-17 DIAGNOSIS — Z131 Encounter for screening for diabetes mellitus: Secondary | ICD-10-CM

## 2016-03-17 DIAGNOSIS — F411 Generalized anxiety disorder: Secondary | ICD-10-CM

## 2016-03-17 LAB — GLUCOSE, POCT (MANUAL RESULT ENTRY): POC GLUCOSE: 92 mg/dL (ref 70–99)

## 2016-03-17 MED ORDER — FLUCONAZOLE 150 MG PO TABS: 150.0000 mg | ORAL_TABLET | Freq: Once | ORAL | 0 refills | Status: AC
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
## 2016-03-17 NOTE — Patient Instructions (Addendum)
Check into EAP(employee assistance program) at work  Melatonin 5mg  at bedtime if needed for sleep.

## 2016-03-18 LAB — CERVICOVAGINAL ANCILLARY ONLY
Chlamydia: NEGATIVE
NEISSERIA GONORRHEA: NEGATIVE
WET PREP (BD AFFIRM): NEGATIVE

## 2016-03-24 ENCOUNTER — Telehealth: Payer: Self-pay

## 2016-03-24 NOTE — Telephone Encounter (Signed)
Patient hipaa verif; RN advised patient Per Sharon SellerMcClung UE:[id number]:Please call patient and let her know that her testing was negative for yeast, bacterial vaginosis, trich, chlamydia, and gonorrhea. Patient verbalized understanding; also requested copy be placed up front for pick-up.  Pollyann KennedyKim Aron Needles, RN, BSN

## 2016-03-24 NOTE — Telephone Encounter (Signed)
-----   Message from Anders SimmondsAngela M McClung, [place] JerseyPA-C sent at 03/20/2016  8:56 AM EDT ----- Please call patient and let her know that her testing was negative for yeast, bacterial vaginosis, trich, chlamydia, and gonorrhea.  Thanks, Georgian CoAngela McClung, PA-C

## 2016-04-17 ENCOUNTER — Encounter: Payer: Self-pay | Admitting: Gastroenterology

## 2016-05-01 ENCOUNTER — Ambulatory Visit: Payer: Medicaid Other | Admitting: *Deleted

## 2016-05-01 ENCOUNTER — Encounter (INDEPENDENT_AMBULATORY_CARE_PROVIDER_SITE_OTHER): Payer: Self-pay

## 2016-05-01 VITALS — Ht 65.0 in | Wt 194.0 lb

## 2016-05-01 DIAGNOSIS — Z800 Family history of malignant neoplasm of digestive organs: Secondary | ICD-10-CM

## 2016-05-01 MED ORDER — SUPREP BOWEL PREP KIT 17.5-3.13-1.6 GM/177ML PO SOLN: 1.0000 | Freq: Once | ORAL | 0 refills | Status: AC

## 2016-05-01 NOTE — Progress Notes (Signed)
Patient denies any allergies to egg or soy products. Patient denies complications with anesthesia/sedation.  Patient denies oxygen use at home and denies diet medications. Emmi instructions for colonoscopy explained but patient denied.  Pamphlet given.  

## 2016-05-14 ENCOUNTER — Ambulatory Visit (AMBULATORY_SURGERY_CENTER): Payer: Self-pay | Admitting: Gastroenterology

## 2016-05-14 ENCOUNTER — Encounter: Payer: Self-pay | Admitting: Gastroenterology

## 2016-05-14 VITALS — BP 115/67 | HR 60 | Temp 99.5°F | Resp 13 | Ht 65.0 in | Wt 194.0 lb

## 2016-05-14 DIAGNOSIS — Z1211 Encounter for screening for malignant neoplasm of colon: Secondary | ICD-10-CM

## 2016-05-14 DIAGNOSIS — Z800 Family history of malignant neoplasm of digestive organs: Secondary | ICD-10-CM

## 2016-05-14 DIAGNOSIS — Z1212 Encounter for screening for malignant neoplasm of rectum: Secondary | ICD-10-CM

## 2016-05-14 MED ORDER — SODIUM CHLORIDE 0.9 % IV SOLN: 500.0000 mL | INTRAVENOUS | Status: DC

## 2016-05-14 NOTE — Progress Notes (Signed)
No problems noted in the recovery room. maw 

## 2016-05-14 NOTE — Patient Instructions (Signed)
YOU HAD AN ENDOSCOPIC PROCEDURE TODAY AT [facility]:   Refer to the procedure report that was given to you for any specific questions about what was found during the examination.  If the procedure report does not answer your questions, please call your gastroenterologist to clarify.  If you requested that your care partner not be given the details of your procedure findings, then the procedure report has been included in a sealed envelope for you to review at your convenience later.  YOU SHOULD EXPECT: Some feelings of bloating in the abdomen. Passage of more gas than usual.  Walking can help get rid of the air that was put into your GI tract during the procedure and reduce the bloating. If you had a lower endoscopy (such as a colonoscopy or flexible sigmoidoscopy) you may notice spotting of blood in your stool or on the toilet paper. If you underwent a bowel prep for your procedure, you may not have a normal bowel movement for a few days.  Please Note:  You might notice some irritation and congestion in your nose or some drainage.  This is from the oxygen used during your procedure.  There is no need for concern and it should clear up in a day or so.  SYMPTOMS TO REPORT IMMEDIATELY:   Following lower endoscopy (colonoscopy or flexible sigmoidoscopy):  Excessive amounts of blood in the stool  Significant tenderness or worsening of abdominal pains  Swelling of the abdomen that is new, acute  Fever of 100F or higher   Following upper endoscopy (EGD)  Vomiting of blood or coffee ground material  New chest pain or pain under the shoulder blades  Painful or persistently difficult swallowing  New shortness of breath  Fever of 100F or higher  Black, tarry-looking stools  For urgent or emergent issues, a gastroenterologist can be reached at any hour by calling [phone].   DIET:  We do recommend a small meal at first, but then you may proceed to your regular diet.  Drink  plenty of fluids but you should avoid alcoholic beverages for 24 hours.  ACTIVITY:  You should plan to take it easy for the rest of today and you should NOT DRIVE or use heavy machinery until tomorrow (because of the sedation medicines used during the test).    FOLLOW UP: Our staff will call the number listed on your records the next business day following your procedure to check on you and address any questions or concerns that you may have regarding the information given to you following your procedure. If we do not reach you, we will leave a message.  However, if you are feeling well and you are not experiencing any problems, there is no need to return our call.  We will assume that you have returned to your regular daily activities without incident.  If any biopsies were taken you will be contacted by phone or by letter within the next 1-3 weeks.  Please call us at [phone] if you have not heard about the biopsies in 3 weeks.    SIGNATURES/CONFIDENTIALITY: You and/or your care partner have signed paperwork which will be entered into your electronic medical record.  These signatures attest to the fact that that the information above on your After Visit Summary has been reviewed and is understood.  Full responsibility of the confidentiality of this discharge information lies with you and/or your care-partner.     You may resume your current medications today. Repeat  colonoscopy in 5 years for screening purposes. Please call if any questions or concerns.

## 2016-05-14 NOTE — Op Note (Signed)
[facility] Patient Name: Suzanne Campbell Procedure Date: 05/14/2016 2:11 PM MRN: [id number] Endoscopist: Sherilyn Cooter L. Myrtie Neither , MD Age: 30 Referring MD:  Date of Birth: 06-Sep-1985 Gender: Female Account #: [id number] Procedure:                Colonoscopy Indications:              Screening in patient at increased risk: Colorectal                            cancer in mother age 55, This is the patient's                            first colonoscopy Medicines:                Monitored Anesthesia Care Procedure:                Pre-Anesthesia Assessment:                           - Prior to the procedure, a History and Physical                            was performed, and patient medications and                            allergies were reviewed. The patient's tolerance of                            previous anesthesia was also reviewed. The risks                            and benefits of the procedure and the sedation                            options and risks were discussed with the patient.                            All questions were answered, and informed consent                            was obtained. Prior Anticoagulants: The patient has                            taken no previous anticoagulant or antiplatelet                            agents. ASA Grade Assessment: II - A patient with                            mild systemic disease. After reviewing the risks                            and benefits, the patient was deemed in  satisfactory condition to undergo the procedure.                           After obtaining informed consent, the colonoscope                            was passed under direct vision. Throughout the                            procedure, the patient's blood pressure, pulse, and                            oxygen saturations were monitored continuously. The                            Model CF-HQ190L [id number]) scope was  introduced                            through the anus and advanced to the the cecum,                            identified by appendiceal orifice and ileocecal                            valve. The colonoscopy was performed without                            difficulty. The patient tolerated the procedure                            well. The quality of the bowel preparation was                            good. The ileocecal valve, appendiceal orifice, and                            rectum were photographed. The quality of the bowel                            preparation was evaluated using the BBPS [facility]                            Bowel Preparation Scale) with scores of: Right                            Colon = 2, Transverse Colon = 2 and Left Colon = 2.                            The total BBPS score equals 6. The bowel                            preparation used was SUPREP. Scope In: 2:15:28 PM Scope Out: 2:27:03 PM Scope Withdrawal Time: 0 hours 8 minutes  47 seconds  Total Procedure Duration: 0 hours 11 minutes 35 seconds  Findings:                 The perianal and digital rectal examinations were                            normal.                           The entire examined colon appeared normal on direct                            and retroflexion views. Complications:            No immediate complications. Estimated Blood Loss:     Estimated blood loss: none. Impression:               - The entire examined colon is normal on direct and                            retroflexion views.                           - No specimens collected. Recommendation:           - Patient has a contact number available for                            emergencies. The signs and symptoms of potential                            delayed complications were discussed with the                            patient. Return to normal activities tomorrow.                            Written discharge  instructions were provided to the                            patient.                           - Resume previous diet.                           - Continue present medications.                           - Repeat colonoscopy in 5 years for screening                            purposes. Henry L. Myrtie Neitheranis, MD 05/14/2016 2:29:42 PM This report has been signed electronically.

## 2016-05-14 NOTE — Progress Notes (Signed)
Report to PACU, RN, vss, BBS= Clear.  

## 2016-05-15 ENCOUNTER — Telehealth: Payer: Self-pay

## 2016-05-15 NOTE — Telephone Encounter (Signed)
  Follow up Call-  Call back number 05/14/2016 05/14/2016  Post procedure Call Back phone  # [phone] -  Permission to leave phone message Yes Yes     Patient questions:  Do you have a fever, pain , or abdominal swelling? No. Pain Score  0 *  Have you tolerated food without any problems? Yes.    Have you been able to return to your normal activities? Yes.    Do you have any questions about your discharge instructions: Diet   No. Medications  No. Follow up visit  No.  Do you have questions or concerns about your Care? No.  Actions: * If pain score is 4 or above: No action needed, pain <4.

## 2016-05-15 NOTE — Telephone Encounter (Signed)
Follow up call, left message on patient's answering maching.   Will try to reach pt. Later today.

## 2016-12-17 ENCOUNTER — Encounter: Payer: Self-pay | Admitting: Family Medicine

## 2016-12-25 ENCOUNTER — Encounter: Payer: Self-pay | Admitting: Family Medicine

## 2016-12-25 ENCOUNTER — Ambulatory Visit: Payer: Medicaid Other | Attending: Family Medicine | Admitting: Family Medicine

## 2016-12-25 ENCOUNTER — Other Ambulatory Visit (HOSPITAL_COMMUNITY)
Admission: RE | Admit: 2016-12-25 | Discharge: 2016-12-25 | Disposition: A | Discharge status: Home / Self Care | Payer: Medicaid Other | Source: Ambulatory Visit | Attending: Family Medicine | Admitting: Family Medicine

## 2016-12-25 VITALS — BP 103/67 | HR 83 | Temp 98.2°F | Wt 200.8 lb

## 2016-12-25 DIAGNOSIS — N760 Acute vaginitis: Secondary | ICD-10-CM

## 2016-12-25 DIAGNOSIS — Z124 Encounter for screening for malignant neoplasm of cervix: Secondary | ICD-10-CM | POA: Diagnosis not present

## 2016-12-25 DIAGNOSIS — Z9889 Other specified postprocedural states: Secondary | ICD-10-CM | POA: Diagnosis not present

## 2016-12-25 DIAGNOSIS — Z23 Encounter for immunization: Secondary | ICD-10-CM | POA: Diagnosis not present

## 2016-12-25 DIAGNOSIS — B9689 Other specified bacterial agents as the cause of diseases classified elsewhere: Secondary | ICD-10-CM | POA: Diagnosis not present

## 2016-12-25 DIAGNOSIS — N946 Dysmenorrhea, unspecified: Secondary | ICD-10-CM | POA: Diagnosis not present

## 2016-12-25 DIAGNOSIS — N898 Other specified noninflammatory disorders of vagina: Secondary | ICD-10-CM | POA: Insufficient documentation

## 2016-12-25 DIAGNOSIS — Z87891 Personal history of nicotine dependence: Secondary | ICD-10-CM | POA: Diagnosis not present

## 2016-12-25 DIAGNOSIS — Z79899 Other long term (current) drug therapy: Secondary | ICD-10-CM | POA: Diagnosis not present

## 2016-12-25 NOTE — Assessment & Plan Note (Signed)
Scheduled NSAID recommended

## 2016-12-25 NOTE — Progress Notes (Signed)
Subjective:  Patient ID: Suzanne ParentsNika N Eckstrom, female    DOB: 12/23/1985  Age: 31 y.o. MRN: [id number]  CC: Gynecologic Exam   HPI Suzanne Campbell presents for    1. Gynecology exam: she is sexually active with one partner. Reports partner developed a bump in genital. She reports his was checked at the health department and told he did not have an STI. She denies lesions in vaginal area. Reports vaginal discharge before menses. Reports heavy menstrual flow and cramping. She has BTL for contraception.   Social History  Substance Use Topics  . Smoking status: Former Smoker    Packs/day: 0.50    Years: 5.00    Types: Cigars    Quit date: 08/04/2008  . Smokeless tobacco: Never Used     Comment: cigars - 4 cigars/pk  . Alcohol use 4.8 oz/week    8 Glasses of wine per week    Outpatient Medications Prior to Visit  Medication Sig Dispense Refill  . Multiple Vitamins-Minerals (HAIR/SKIN/NAILS PO) Take 1 tablet by mouth daily.     Facility-Administered Medications Prior to Visit  Medication Dose Route Frequency Provider Last Rate Last Dose  . 0.9 %  sodium chloride infusion  500 mL Intravenous Continuous Danis, Starr LakeHenry L III, MD        ROS Review of Systems  Constitutional: Negative for chills and fever.  Eyes: Negative for visual disturbance.  Respiratory: Negative for shortness of breath.   Cardiovascular: Negative for chest pain.  Gastrointestinal: Negative for abdominal pain and blood in stool.  Genitourinary: Positive for vaginal discharge.  Musculoskeletal: Negative for arthralgias and back pain.  Skin: Negative for rash.  Allergic/Immunologic: Negative for immunocompromised state.  Hematological: Negative for adenopathy. Does not bruise/bleed easily.  Psychiatric/Behavioral: Negative for dysphoric mood and suicidal ideas.    Objective:  BP 103/67   Pulse 83   Temp 98.2 F (36.8 C) (Oral)   Wt 200 lb 12.8 oz (91.1 kg)   SpO2 97%   BMI 33.41 kg/m   BP/Weight 12/25/2016  05/14/2016 05/01/2016  Systolic BP 103 115 -  Diastolic BP 67 67 -  Wt. (Lbs) 200.8 194 194  BMI 33.41 32.28 32.28     Physical Exam  Constitutional: She is oriented to person, place, and time. She appears well-developed and well-nourished. No distress.  HENT:  Head: Normocephalic and atraumatic.  Cardiovascular: Normal rate, regular rhythm, normal heart sounds and intact distal pulses.   Pulmonary/Chest: Effort normal and breath sounds normal.  Genitourinary: Uterus normal. Pelvic exam was performed with patient prone. There is no rash, tenderness or lesion on the right labia. There is no rash, tenderness or lesion on the left labia. Cervix exhibits no motion tenderness, no discharge and no friability. Vaginal discharge (moderate, thin, white ) found.  Musculoskeletal: She exhibits no edema.  Lymphadenopathy:       Right: No inguinal adenopathy present.       Left: No inguinal adenopathy present.  Neurological: She is alert and oriented to person, place, and time.  Skin: Skin is warm and dry. No rash noted.  Psychiatric: She has a normal mood and affect.     Assessment & Plan:  Ardis Rowanika was seen today for gynecologic exam.  Diagnoses and all orders for this visit:  Pap smear for cervical cancer screening -     Cytology - PAP  Vaginal discharge -     Cytology - PAP  Dysmenorrhea   There are no diagnoses linked to this encounter.  No orders of the defined types were placed in this encounter.   Follow-up: Return in about 3 months (around 03/27/2017) for dysmenorrhea.   Dessa Phi MD

## 2016-12-25 NOTE — Patient Instructions (Addendum)
Suzanne Campbell was seen today for gynecologic exam.  Diagnoses and all orders for this visit:  Pap smear for cervical cancer screening -     Cytology - PAP  Vaginal discharge -     Cytology - PAP   For pain with menstrual periods take the following Naproxen 500 mg or ibuprofen 600 mg twice daily with food starting one day before your period and first two days of your period  You will be called with pap results   F/u in 3 months for pain with menses sooner if needed   Dr. Armen PickupFunches    Dysmenorrhea Dysmenorrhea means painful cramps during your period (menstrual period). You will have pain in your lower belly (abdomen). The pain is caused by the tightening (contracting) of the muscles of the womb (uterus). The pain may be mild or very bad. With this condition, you may:  Have a headache.  Feel sick to your stomach (nauseous).  Throw up (vomit).  Have lower back pain. Follow these instructions at home: Helping pain and cramping   Put heat on your lower back or belly when you have pain or cramps. Use the heat source that your doctor tells you to use.  Place a towel between your skin and the heat.  Leave the heat on for 20-30 minutes.  Remove the heat if your skin turns bright red. This is especially important if you cannot feel pain, heat, or cold.  Do not have a heating pad on during sleep.  Do aerobic exercises. These include walking, swimming, or biking. These may help with cramps.  Massage your lower back or belly. This may help lessen pain. General instructions   Take over-the-counter and prescription medicines only as told by your doctor.  Do not drive or use heavy machinery while taking prescription pain medicine.  Avoid alcohol and caffeine during and right before your period. These can make cramps worse.  Do not use any products that have nicotine or tobacco. These include cigarettes and e-cigarettes. If you need help quitting, ask your doctor.  Keep all follow-up  visits as told by your doctor. This is important. Contact a doctor if:  You have pain that gets worse.  You have pain that does not get better with medicine.  You have pain during sex.  You feel sick to your stomach or you throw up during your period, and medicine does not help. Get help right away if:  You pass out (faint). Summary  Dysmenorrhea means painful cramps during your period (menstrual period).  Put heat on your lower back or belly when you have pain or cramps.  Do exercises like walking, swimming, or biking to help with cramps.  Contact a doctor if you have pain during sex. This information is not intended to replace advice given to you by your health care provider. Make sure you discuss any questions you have with your health care provider. Document Released: 10/17/2008 Document Revised: 08/07/2016 Document Reviewed: 08/07/2016 Elsevier Interactive Patient Education  2017 [employer]

## 2016-12-26 LAB — CERVICOVAGINAL ANCILLARY ONLY: Wet Prep (BD Affirm): POSITIVE — AB

## 2016-12-29 MED ORDER — METRONIDAZOLE 0.75 % VA GEL: 1.0000 | Freq: Two times a day (BID) | VAGINAL | 0 refills | Status: DC

## 2016-12-29 NOTE — Addendum Note (Signed)
Addended by: Dessa PhiFUNCHES, Onix Jumper on: 12/29/2016 09:59 AM   Modules accepted: Orders

## 2016-12-30 ENCOUNTER — Telehealth: Payer: Self-pay

## 2016-12-30 LAB — CYTOLOGY - PAP
Chlamydia: POSITIVE — AB
Diagnosis: NEGATIVE
HPV: NOT DETECTED
NEISSERIA GONORRHEA: NEGATIVE

## 2016-12-30 NOTE — Telephone Encounter (Signed)
Pt was called and informed of lab results. Pt has a nurse visit set up on 12/31/2016 @4 :00

## 2016-12-31 ENCOUNTER — Ambulatory Visit: Payer: Medicaid Other | Attending: Family Medicine | Admitting: *Deleted

## 2016-12-31 DIAGNOSIS — A64 Unspecified sexually transmitted disease: Secondary | ICD-10-CM | POA: Diagnosis present

## 2016-12-31 MED ORDER — AZITHROMYCIN 500 MG PO TABS
1000.0000 mg | ORAL_TABLET | Freq: Once | ORAL | Status: AC
  Administered 2016-12-31: 1000 mg via ORAL

## 2016-12-31 NOTE — Progress Notes (Signed)
[facility] and oriented and arrives in good spirits. Last OV 12/25/16  with Dr. Armen PickupFunches.   She is asked to come in for nurse visit to take azithromycin 1000 mg PO x one  Pt tolerated medication. No signs of allergic reaction 15 minutes after taking medication. Pt educated how to sign up on my chart for results.

## 2017-01-05 ENCOUNTER — Telehealth: Payer: Self-pay | Admitting: *Deleted

## 2017-01-05 NOTE — Telephone Encounter (Signed)
After speaking to Dr. Armen PickupFunches, pt will need to come to be retested, on June 6.  Verbal order for urine cytology to be placed.

## 2017-01-05 NOTE — Telephone Encounter (Signed)
Pt states she is needing to be retested  for chlamydia since she re-started intercourse. She states she did not wait 7 days. Attempted inform the patient that message will be given to provider for clarification, however she was rude and hung up the phone after getting writer's name.

## 2017-01-05 NOTE — Telephone Encounter (Signed)
Patient called states she would like to talk to PCP, patient has been treated for  STI but states she has intercourse and would like to know what she needs to do. Please f/up

## 2017-01-05 NOTE — Telephone Encounter (Signed)
Pt was called and informed to make appointment for 01/07/17

## 2017-01-06 ENCOUNTER — Encounter (HOSPITAL_COMMUNITY): Payer: Self-pay | Admitting: Emergency Medicine

## 2017-01-06 ENCOUNTER — Ambulatory Visit (HOSPITAL_COMMUNITY)
Admission: EM | Admit: 2017-01-06 | Discharge: 2017-01-06 | Disposition: A | Discharge status: Home / Self Care | Payer: Medicaid Other | Attending: Internal Medicine | Admitting: Internal Medicine

## 2017-01-06 DIAGNOSIS — Z800 Family history of malignant neoplasm of digestive organs: Secondary | ICD-10-CM | POA: Diagnosis not present

## 2017-01-06 DIAGNOSIS — Z880 Allergy status to penicillin: Secondary | ICD-10-CM | POA: Diagnosis not present

## 2017-01-06 DIAGNOSIS — Z202 Contact with and (suspected) exposure to infections with a predominantly sexual mode of transmission: Secondary | ICD-10-CM | POA: Insufficient documentation

## 2017-01-06 DIAGNOSIS — Z803 Family history of malignant neoplasm of breast: Secondary | ICD-10-CM | POA: Diagnosis not present

## 2017-01-06 DIAGNOSIS — Z9851 Tubal ligation status: Secondary | ICD-10-CM | POA: Insufficient documentation

## 2017-01-06 DIAGNOSIS — Z87891 Personal history of nicotine dependence: Secondary | ICD-10-CM | POA: Insufficient documentation

## 2017-01-06 DIAGNOSIS — R1031 Right lower quadrant pain: Secondary | ICD-10-CM | POA: Diagnosis not present

## 2017-01-06 DIAGNOSIS — Z3202 Encounter for pregnancy test, result negative: Secondary | ICD-10-CM

## 2017-01-06 LAB — POCT URINALYSIS DIP (DEVICE)
Bilirubin Urine: NEGATIVE
Glucose, UA: NEGATIVE mg/dL
Ketones, ur: NEGATIVE mg/dL
LEUKOCYTES UA: NEGATIVE
NITRITE: NEGATIVE
PH: 7.5 (ref 5.0–8.0)
PROTEIN: NEGATIVE mg/dL
Specific Gravity, Urine: 1.02 (ref 1.005–1.030)
UROBILINOGEN UA: 0.2 mg/dL (ref 0.0–1.0)

## 2017-01-06 LAB — POCT PREGNANCY, URINE: Preg Test, Ur: NEGATIVE

## 2017-01-06 MED ORDER — AZITHROMYCIN 250 MG PO TABS
1000.0000 mg | ORAL_TABLET | Freq: Once | ORAL | Status: AC
  Administered 2017-01-06: 1000 mg via ORAL

## 2017-01-06 MED ORDER — AZITHROMYCIN 250 MG PO TABS
ORAL_TABLET | ORAL | Status: AC
  Filled 2017-01-06: qty 4

## 2017-01-06 NOTE — ED Triage Notes (Signed)
[facility]. The patient stated that she tested positive for chlamydia and was treated at her OB/Gyn. She stated that she was not advised to wait for sexual contact and was re-exposed. The patient reported pelvic pain and amenorrhea.

## 2017-01-06 NOTE — ED Provider Notes (Signed)
[facility]    CSN: [id number] Arrival date & time: 01/06/17  1011     History   Chief Complaint Chief Complaint  Patient presents with  . Exposure to STD    HPI Suzanne Campbell is a 31 y.o. female. She presents today with recent history of Chlamydia positive at OB/GYN's office, was treated in the last week or so with Zithromax by mouth. Partner was treated as well. They did not abstain from intercourse immediately after treatment, and she is having some difficulty with mild right lower quadrant discomfort, crampy, and is 3 weeks late for her period. History of tubal ligation. Would like retesting. No fever, no malaise. No dysuria, no urinary frequency. No vaginal discharge. No change in bowel habits.    HPI  Past Medical History:  Diagnosis Date  . 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 Family history of colon cancer    mother  . Family history of stomach cancer    MGF    Patient Active Problem List   Diagnosis Date Noted  . Dysmenorrhea 12/25/2016  . Genetic testing 12/28/2015  . Family history of colon cancer   . Family history of breast cancer   . Family history of stomach cancer   . Overweight (BMI 25.0-29.9) 03/24/2014  . Family history of cancer 03/24/2014  . Family history of colon cancer in mother 03/24/2014  . Excessive cerumen in both ear canals 03/24/2014    Past Surgical History:  Procedure Laterality Date  . CESAREAN SECTION  2007   x 4 children   . TUBAL LIGATION  2013    OB History    Gravida Para Term Preterm AB Living   4 4 4     4    SAB TAB Ectopic Multiple Live Births                   Home Medications   Takes no meds regularly  Family History Family History  Problem Relation Age of Onset  . Colon cancer Mother 66  . Stomach cancer Maternal Grandfather        dx in his 44s-60s  . Breast cancer Maternal Aunt 30       possible BRCA testing  . Breast cancer Maternal Aunt 42  . Diabetes Neg Hx   .  Heart disease Neg Hx   . Hypertension Neg Hx   . Rectal cancer Neg Hx     Social History Social History  Substance Use Topics  . Smoking status: Former Smoker    Packs/day: 0.50    Years: 5.00    Types: Cigars    Quit date: 08/04/2008  . Smokeless tobacco: Never Used     Comment: cigars - 4 cigars/pk  . Alcohol use 4.8 oz/week    8 Glasses of wine per week     Allergies   Penicillins   Review of Systems Review of Systems  All other systems reviewed and are negative.    Physical Exam Triage Vital Signs ED Triage Vitals  Enc Vitals Group     BP 01/06/17 1058 124/66     Pulse Rate 01/06/17 1058 69     Resp 01/06/17 1058 18     Temp 01/06/17 1058 98.5 F (36.9 C)     Temp Source 01/06/17 1058 Oral     SpO2 01/06/17 1058 98 %     Weight --      Height --  Pain Score 01/06/17 1057 0     Pain Loc --    Updated Vital Signs BP 124/66 (BP Location: Right Arm)   Pulse 69   Temp 98.5 F (36.9 C) (Oral)   Resp 18   LMP 11/04/2016 (Exact Date)   SpO2 98%   Physical Exam  Constitutional: She is oriented to person, place, and time. No distress.  HENT:  Head: Atraumatic.  Eyes:  Conjugate gaze observed, no eye redness/discharge  Neck: Neck supple.  Cardiovascular: Normal rate and regular rhythm.   Pulmonary/Chest: No respiratory distress. She has no wheezes. She has no rales.  Lungs clear, symmetric breath sounds throughout  Abdominal: Soft. She exhibits no distension. There is no tenderness. There is no rebound and no guarding.  Benign abdominal exam  Musculoskeletal: Normal range of motion.  Neurological: She is alert and oriented to person, place, and time.  Skin: Skin is warm and dry.  Nursing note and vitals reviewed.    UC Treatments / Results  Labs Results for orders placed or performed during the hospital encounter of 01/06/17  POCT urinalysis dip (device)  Result Value Ref Range   Glucose, UA NEGATIVE NEGATIVE mg/dL   Bilirubin Urine NEGATIVE  NEGATIVE   Ketones, ur NEGATIVE NEGATIVE mg/dL   Specific Gravity, Urine 1.020 1.005 - 1.030   Hgb urine dipstick TRACE (A) NEGATIVE   pH 7.5 5.0 - 8.0   Protein, ur NEGATIVE NEGATIVE mg/dL   Urobilinogen, UA 0.2 0.0 - 1.0 mg/dL   Nitrite NEGATIVE NEGATIVE   Leukocytes, UA NEGATIVE NEGATIVE  Pregnancy, urine POC  Result Value Ref Range   Preg Test, Ur NEGATIVE NEGATIVE    Procedures Procedures (including critical care time)  Medications Ordered in UC Medications  azithromycin (ZITHROMAX) tablet 1,000 mg (1,000 mg Oral Given 01/06/17 1140)     Final Clinical Impressions(s) / UC Diagnoses   Final diagnoses:  Abdominal pain, right lower quadrant   Testing for common pelvic infections is pending.  Oral dose of zithromax given today.  Pregnancy test was negative at the urgent care.  Recheck or followup with OB/Gyn or PCP if pelvic pain is not improving in the next several days.  Abstain from intercourse for the next 2 weeks to give the medicines time to work and decrease risk of re-infection.     Sherlene Shams, MD 01/07/17 1538

## 2017-01-06 NOTE — Discharge Instructions (Addendum)
Testing for common pelvic infections is pending.  Oral dose of zithromax given today.  Pregnancy test was negative at the urgent care.  Recheck or followup with OB/Gyn or PCP if pelvic pain is not improving in the next several days.  Abstain from intercourse for the next 2 weeks to give the medicines time to work and decrease risk of re-infection.

## 2017-01-08 LAB — URINE CYTOLOGY ANCILLARY ONLY
Chlamydia: POSITIVE — AB
Neisseria Gonorrhea: NEGATIVE
TRICH (WINDOWPATH): NEGATIVE

## 2017-01-09 LAB — CERVICOVAGINAL ANCILLARY ONLY
BACTERIAL VAGINITIS: POSITIVE — AB
Candida vaginitis: NEGATIVE

## 2017-02-16 ENCOUNTER — Encounter (HOSPITAL_COMMUNITY): Payer: Self-pay | Admitting: Emergency Medicine

## 2017-02-16 ENCOUNTER — Ambulatory Visit (HOSPITAL_COMMUNITY)
Admission: EM | Admit: 2017-02-16 | Discharge: 2017-02-16 | Disposition: A | Discharge status: Home / Self Care | Payer: Medicaid Other | Attending: Family Medicine | Admitting: Family Medicine

## 2017-02-16 DIAGNOSIS — N898 Other specified noninflammatory disorders of vagina: Secondary | ICD-10-CM | POA: Insufficient documentation

## 2017-02-16 DIAGNOSIS — Z880 Allergy status to penicillin: Secondary | ICD-10-CM | POA: Diagnosis not present

## 2017-02-16 MED ORDER — AZITHROMYCIN 250 MG PO TABS
1000.0000 mg | ORAL_TABLET | Freq: Once | ORAL | Status: AC
  Administered 2017-02-16: 1000 mg via ORAL

## 2017-02-16 MED ORDER — AZITHROMYCIN 250 MG PO TABS
ORAL_TABLET | ORAL | Status: AC
  Filled 2017-02-16: qty 4

## 2017-02-16 NOTE — Discharge Instructions (Signed)
You have been given the following medications today for treatment of suspected chlamydia:  azithromycin (ZITHROMAX) tablet 1,000 mg  Even though we have treated you today for Chlamydia, we have sent testing for sexually transmitted infections. We will notify you of the results once they are received. If needed we will prescribe any medications needed.

## 2017-02-16 NOTE — ED Triage Notes (Signed)
[facility] a [facility] possible [facility]D [facility].

## 2017-02-16 NOTE — ED Provider Notes (Signed)
  [facility]   [id number] 02/16/17 Arrival Time: 1106  ASSESSMENT & PLAN:  1. Vaginal discharge    You have been given the following medications today for treatment of suspected chlamydia:  azithromycin (ZITHROMAX) tablet 1,000 mg  Even though we have treated you today for Chlamydia, we have sent testing for sexually transmitted infections. We will notify you of the results once they are received. If needed we will prescribe any medications needed.  Reviewed expectations re: course of current medical issues. Questions answered. Outlined signs and symptoms indicating need for more acute intervention. Patient verbalized understanding. After Visit Summary given.   SUBJECTIVE:  Suzanne Campbell is a 31 y.o. female who was seen on 01/06/17 and treated for Chlamydia. Symptoms resolved. Felt back to normal. Same sexual partner was also treated (pt verbal report). Past 2 days having the same symptoms. Vaginal discharge mainly. Afebrile. No abdominal or pelvic pain. No urinary symptoms. No flank pain. No n/v.  ROS: As per HPI.   OBJECTIVE:  Vitals:   02/16/17 1150 02/16/17 1152  BP: 120/72 120/72  Pulse: 65 65  Resp: 18 18  Temp: 98.2 F (36.8 C) 98.2 F (36.8 C)  TempSrc: Oral Oral  SpO2: 100% 100%     General appearance: alert; no distress Abdomen: soft, non-tender; bowel sounds normal; no masses or organomegaly; no guarding or rebound tenderness GU: deferred Back: no CVA tenderness Skin: warm and dry   Labs Reviewed  URINE CYTOLOGY ANCILLARY ONLY  Pending  Allergies  Allergen Reactions  . Penicillins Hives    PMHx, SurgHx, SocialHx, Medications, and Allergies were reviewed in the Visit Navigator and updated as appropriate.      Mardella LaymanHagler, Gautam Langhorst, MD 02/16/17 [phone]

## 2017-02-17 LAB — URINE CYTOLOGY ANCILLARY ONLY
Chlamydia: NEGATIVE
NEISSERIA GONORRHEA: NEGATIVE
TRICH (WINDOWPATH): NEGATIVE

## 2017-02-19 LAB — URINE CYTOLOGY ANCILLARY ONLY
BACTERIAL VAGINITIS: POSITIVE — AB
Candida vaginitis: NEGATIVE

## 2017-02-20 ENCOUNTER — Telehealth (HOSPITAL_COMMUNITY): Payer: Self-pay | Admitting: Internal Medicine

## 2017-02-20 MED ORDER — METRONIDAZOLE 500 MG PO TABS: 500.0000 mg | ORAL_TABLET | Freq: Two times a day (BID) | ORAL | 0 refills | Status: DC

## 2017-02-20 NOTE — Telephone Encounter (Signed)
Clinical staff please let patient know that test for gardnerella (bacterial vaginosis) was positive. Rx metronidazole was sent to the pharmacy of record.  Recheck or followup with primary care provider for further evaluation if symptoms are not improving.  LM

## 2017-03-30 ENCOUNTER — Ambulatory Visit: Payer: Medicaid Other | Admitting: Internal Medicine

## 2017-03-31 ENCOUNTER — Ambulatory Visit: Payer: Medicaid Other | Admitting: Internal Medicine

## 2017-11-07 ENCOUNTER — Encounter (HOSPITAL_COMMUNITY): Payer: Self-pay | Admitting: *Deleted

## 2017-11-07 ENCOUNTER — Ambulatory Visit (HOSPITAL_COMMUNITY)
Admission: EM | Admit: 2017-11-07 | Discharge: 2017-11-07 | Disposition: A | Discharge status: Home / Self Care | Payer: Medicaid Other | Attending: Internal Medicine | Admitting: Internal Medicine

## 2017-11-07 ENCOUNTER — Other Ambulatory Visit: Payer: Self-pay

## 2017-11-07 DIAGNOSIS — Z87891 Personal history of nicotine dependence: Secondary | ICD-10-CM | POA: Diagnosis not present

## 2017-11-07 DIAGNOSIS — R300 Dysuria: Secondary | ICD-10-CM

## 2017-11-07 DIAGNOSIS — L298 Other pruritus: Secondary | ICD-10-CM | POA: Diagnosis present

## 2017-11-07 DIAGNOSIS — Z800 Family history of malignant neoplasm of digestive organs: Secondary | ICD-10-CM | POA: Insufficient documentation

## 2017-11-07 DIAGNOSIS — Z880 Allergy status to penicillin: Secondary | ICD-10-CM | POA: Diagnosis not present

## 2017-11-07 DIAGNOSIS — Z3202 Encounter for pregnancy test, result negative: Secondary | ICD-10-CM | POA: Diagnosis not present

## 2017-11-07 DIAGNOSIS — N760 Acute vaginitis: Secondary | ICD-10-CM

## 2017-11-07 DIAGNOSIS — Z803 Family history of malignant neoplasm of breast: Secondary | ICD-10-CM | POA: Diagnosis not present

## 2017-11-07 DIAGNOSIS — N898 Other specified noninflammatory disorders of vagina: Secondary | ICD-10-CM

## 2017-11-07 LAB — POCT URINALYSIS DIP (DEVICE)
BILIRUBIN URINE: NEGATIVE
GLUCOSE, UA: NEGATIVE mg/dL
NITRITE: NEGATIVE
Protein, ur: 100 mg/dL — AB
Specific Gravity, Urine: >=1.03 (ref 1.005–1.030)
Urobilinogen, UA: 0.2 mg/dL (ref 0.0–1.0)
pH: 6 (ref 5.0–8.0)

## 2017-11-07 LAB — POCT PREGNANCY, URINE: PREG TEST UR: NEGATIVE

## 2017-11-07 MED ORDER — FLUCONAZOLE 200 MG PO TABS: ORAL_TABLET | ORAL | 0 refills | Status: DC

## 2017-11-07 MED ORDER — METRONIDAZOLE 500 MG PO TABS: 500.0000 mg | ORAL_TABLET | Freq: Two times a day (BID) | ORAL | 0 refills | Status: AC

## 2017-11-07 NOTE — Discharge Instructions (Signed)
We will treat you today for yeast and bacteria. Take as directed. Will notify you of any positive findings from your testing and if any changes to treatment are needed.   Please withhold from intercourse for the next week. Please use condoms to prevent STD's.   If symptoms worsen or do not improve in the next week to return to be seen or to follow up with your PCP.

## 2017-11-07 NOTE — ED Provider Notes (Signed)
[place]    CSN: [id number] Arrival date & time: 11/07/17  1257     History   Chief Complaint Chief Complaint  Patient presents with  . Vaginal Itching  . Groin Swelling    HPI Suzanne Campbell is a 32 y.o. female.   [name] presents with complaints of vaginal irritation and itching after having sex two days ago with her partner. She states that they had sex for a longer period of time than normal. The next morning she felt swollen and irritated to vulva. Feels like this is worsening. Has not noticed discharge. Has had chlamydia as well as bv in the past. She feels she has some bleeding due to the irritation, LMP 3/16. She does not use condoms. Denies pelvic pain or fevers. Painful with urination to vulva but without frequency or urethral pain.    ROS per HPI.      Past Medical History:  Diagnosis Date  . 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 Family history of colon cancer    mother  . Family history of stomach cancer    MGF    Patient Active Problem List   Diagnosis Date Noted  . Dysmenorrhea 12/25/2016  . Genetic testing 12/28/2015  . Family history of colon cancer   . Family history of breast cancer   . Family history of stomach cancer   . Overweight (BMI 25.0-29.9) 03/24/2014  . Family history of cancer 03/24/2014  . Family history of colon cancer in mother 03/24/2014  . Excessive cerumen in both ear canals 03/24/2014    Past Surgical History:  Procedure Laterality Date  . CESAREAN SECTION  2007   x 4 children   . TUBAL LIGATION  2013    OB History    Gravida  4   Para  4   Term  4   Preterm      AB      Living  4     SAB      TAB      Ectopic      Multiple      Live Births               Home Medications    Prior to Admission medications   Medication Sig Start Date End Date Taking? Authorizing Provider  fluconazole (DIFLUCAN) 200 MG tablet Take once today. Take second pill at completion of  antibiotics. 11/07/17   Zigmund Gottron, NP  metroNIDAZOLE (FLAGYL) 500 MG tablet Take 1 tablet (500 mg total) by mouth 2 (two) times daily for 7 days. 11/07/17 11/14/17  Zigmund Gottron, NP    Family History Family History  Problem Relation Age of Onset  . Colon cancer Mother 27  . Stomach cancer Maternal Grandfather        dx in his 65s-60s  . Breast cancer Maternal Aunt 30       possible BRCA testing  . Breast cancer Maternal Aunt 42  . Diabetes Neg Hx   . Heart disease Neg Hx   . Hypertension Neg Hx   . Rectal cancer Neg Hx     Social History Social History   Tobacco Use  . Smoking status: Former Smoker    Packs/day: 0.50    Years: 5.00    Pack years: 2.50    Types: Cigars    Last attempt to quit: 08/04/2008    Years since quitting: 9.2  . Smokeless tobacco: Never  Used  . Tobacco comment: cigars - 4 cigars/pk  Substance Use Topics  . Alcohol use: Yes    Alcohol/week: 4.8 oz    Types: 8 Glasses of wine per week  . Drug use: No     Allergies   Penicillins   Review of Systems Review of Systems   Physical Exam Triage Vital Signs ED Triage Vitals  Enc Vitals Group     BP 11/07/17 1416 (!) 114/54     Pulse Rate 11/07/17 1416 82     Resp --      Temp 11/07/17 1416 98.2 F (36.8 C)     Temp Source 11/07/17 1416 Oral     SpO2 11/07/17 1416 99 %     Weight --      Height --      Head Circumference --      Peak Flow --      Pain Score 11/07/17 1413 6     Pain Loc --      Pain Edu? --      Excl. in [place]? --    No data found.  Updated Vital Signs BP (!) 114/54 (BP Location: Left Arm)   Pulse 82   Temp 98.2 F (36.8 C) (Oral)   SpO2 99%   Visual Acuity Right Eye Distance:   Left Eye Distance:   Bilateral Distance:    Right Eye Near:   Left Eye Near:    Bilateral Near:     Physical Exam  Constitutional: She is oriented to person, place, and time. She appears well-developed and well-nourished. No distress.  Cardiovascular: Normal rate, regular  rhythm and normal heart sounds.  Pulmonary/Chest: Effort normal and breath sounds normal.  Abdominal: Soft. There is no tenderness.  Genitourinary: There is tenderness on the right labia. There is no lesion on the right labia. There is tenderness on the left labia. There is no lesion on the left labia. There is tenderness in the vagina. Vaginal discharge found.  Genitourinary Comments: Thick white chunky discharge as well as creamy yellow discharge noted; generally tender to vulva as well as with speculum exam; without specific bleeding from cervix noted; without lesions, open skin or sores  Neurological: She is alert and oriented to person, place, and time.  Skin: Skin is warm and dry.     UC Treatments / Results  Labs (all labs ordered are listed, but only abnormal results are displayed) Labs Reviewed  POCT URINALYSIS DIP (DEVICE) - Abnormal; Notable for the following components:      Result Value   Ketones, ur TRACE (*)    Hgb urine dipstick LARGE (*)    Protein, ur 100 (*)    Leukocytes, UA SMALL (*)    All other components within normal limits  URINE CULTURE  POCT PREGNANCY, URINE  URINE CYTOLOGY ANCILLARY ONLY    EKG None Radiology No results found.  Procedures Procedures (including critical care time)  Medications Ordered in UC Medications - No data to display   Initial Impression / Assessment and Plan / UC Course  I have reviewed the triage vital signs and the nursing notes.  Pertinent labs & imaging results that were available during my care of the patient were reviewed by me and considered in my medical decision making (see chart for details).     Will treat with diflucan and flagyl at this time. Urine cytology pending. Will notify of any positive findings and if any changes to treatment are needed.  Encouraged safe  sex practices. Patient verbalized understanding and agreeable to plan.    Final Clinical Impressions(s) / UC Diagnoses   Final diagnoses:    Acute vaginitis    ED Discharge Orders        Ordered    metroNIDAZOLE (FLAGYL) 500 MG tablet  2 times daily     11/07/17 1442    fluconazole (DIFLUCAN) 200 MG tablet     11/07/17 1442       Controlled Substance Prescriptions [place] Controlled Substance Registry consulted? Not Applicable   Zigmund Gottron, NP 11/07/17 1447

## 2017-11-07 NOTE — ED Triage Notes (Signed)
Per pt having some discomfort in her vaginal area and keeps scratching it and now it's bleeding

## 2017-11-09 ENCOUNTER — Telehealth (HOSPITAL_COMMUNITY): Payer: Self-pay

## 2017-11-09 LAB — URINE CULTURE

## 2017-11-09 LAB — URINE CYTOLOGY ANCILLARY ONLY
Chlamydia: NEGATIVE
NEISSERIA GONORRHEA: NEGATIVE
TRICH (WINDOWPATH): NEGATIVE

## 2017-11-09 NOTE — Telephone Encounter (Signed)
Pt contacted regarding results from recent visit being within normal range. Answered all questions. 

## 2017-11-10 LAB — URINE CYTOLOGY ANCILLARY ONLY: CANDIDA VAGINITIS: NEGATIVE

## 2017-11-11 ENCOUNTER — Telehealth (HOSPITAL_COMMUNITY): Payer: Self-pay

## 2017-11-11 NOTE — Telephone Encounter (Signed)
Pt contacted regarding test for gardnerella (bacterial vaginosis) being positive.  Prescription for metronidazole was given at the urgent care visit. Answered all questions.  

## 2017-12-03 ENCOUNTER — Ambulatory Visit: Payer: Medicaid Other

## 2017-12-08 ENCOUNTER — Ambulatory Visit (HOSPITAL_COMMUNITY)
Admission: EM | Admit: 2017-12-08 | Discharge: 2017-12-08 | Disposition: A | Discharge status: Home / Self Care | Payer: Medicaid Other | Attending: Family Medicine | Admitting: Family Medicine

## 2017-12-08 ENCOUNTER — Other Ambulatory Visit: Payer: Self-pay

## 2017-12-08 ENCOUNTER — Encounter (HOSPITAL_COMMUNITY): Payer: Self-pay | Admitting: Emergency Medicine

## 2017-12-08 DIAGNOSIS — B9689 Other specified bacterial agents as the cause of diseases classified elsewhere: Secondary | ICD-10-CM | POA: Diagnosis not present

## 2017-12-08 DIAGNOSIS — N760 Acute vaginitis: Secondary | ICD-10-CM

## 2017-12-08 MED ORDER — METRONIDAZOLE 500 MG PO TABS: 500.0000 mg | ORAL_TABLET | Freq: Two times a day (BID) | ORAL | 0 refills | Status: DC

## 2017-12-08 MED ORDER — FLUCONAZOLE 200 MG PO TABS: ORAL_TABLET | ORAL | 0 refills | Status: DC

## 2017-12-08 NOTE — ED Provider Notes (Signed)
[place]   [id number] 12/08/17 Arrival Time: [place]   SUBJECTIVE:  Suzanne Campbell is a 32 y.o. female who presents to the urgent care with complaint of recent diagnosis  BV at the beginning of April.  Pt was prescribed medication but she states she dropped them in the toilet and never got to take all of her medicine.    Her symptoms cleared initially, but she dropped her remaining medicine in the sink and didn't finish it.  Symptoms came back.  Also has white discharge returning for which she had taken Diflucan  Patient just started to work at [employer].   Past Medical History:  Diagnosis Date  . 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 Family history of colon cancer    mother  . Family history of stomach cancer    MGF   Family History  Problem Relation Age of Onset  . Colon cancer Mother 32  . Stomach cancer Maternal Grandfather        dx in his 13s-60s  . Breast cancer Maternal Aunt 30       possible BRCA testing  . Breast cancer Maternal Aunt 42  . Diabetes Neg Hx   . Heart disease Neg Hx   . Hypertension Neg Hx   . Rectal cancer Neg Hx    Social History   Socioeconomic History  . Marital status: Legally Separated    Spouse name: Not on file  . Number of children: 4  . Years of education: Not on file  . Highest education level: Not on file  Occupational History  . Not on file  Social Needs  . Financial resource strain: Not on file  . Food insecurity:    Worry: Not on file    Inability: Not on file  . Transportation needs:    Medical: Not on file    Non-medical: Not on file  Tobacco Use  . Smoking status: Former Smoker    Packs/day: 0.50    Years: 5.00    Pack years: 2.50    Types: Cigars    Last attempt to quit: 08/04/2008    Years since quitting: 9.3  . Smokeless tobacco: Never Used  . Tobacco comment: cigars - 4 cigars/pk  Substance and Sexual Activity  . Alcohol use: Yes    Alcohol/week: 4.8 oz    Types: 8 Glasses of  wine per week  . Drug use: No  . Sexual activity: Yes    Birth control/protection: Surgical, Condom  Lifestyle  . Physical activity:    Days per week: Not on file    Minutes per session: Not on file  . Stress: Not on file  Relationships  . Social connections:    Talks on phone: Not on file    Gets together: Not on file    Attends religious service: Not on file    Active member of club or organization: Not on file    Attends meetings of clubs or organizations: Not on file    Relationship status: Not on file  . Intimate partner violence:    Fear of current or ex partner: Not on file    Emotionally abused: Not on file    Physically abused: Not on file    Forced sexual activity: Not on file  Other Topics Concern  . Not on file  Social History Narrative  . Not on file   No outpatient medications have been marked as taking for the  12/08/17 encounter [facility] Encounter).   Allergies  Allergen Reactions  . Penicillins Hives      ROS: As per HPI, remainder of ROS negative.   OBJECTIVE:   Vitals:   12/08/17 1618  BP: 122/65  Pulse: 72  Temp: 98.4 F (36.9 C)  SpO2: 100%     General appearance: alert; no distress Eyes: PERRL; EOMI; conjunctiva normal HENT: normocephalic; atraumatic;oral mucosa normal Neck: supple Back: no CVA tenderness Extremities: no cyanosis or edema; symmetrical with no gross deformities Skin: warm and dry Neurologic: normal gait; grossly normal Psychological: alert and cooperative; normal mood and affect      Labs:  Results for orders placed or performed during the hospital encounter of 11/07/17  Urine culture  Result Value Ref Range   Specimen Description URINE, RANDOM    Special Requests      NONE Performed at [facility] [address] [place], [place] [address]    Culture MULTIPLE SPECIES PRESENT, SUGGEST RECOLLECTION (A)    Report Status 11/09/2017 FINAL   Pregnancy, urine POC  Result Value Ref Range   Preg Test,  Ur NEGATIVE NEGATIVE  POCT urinalysis dip (device)  Result Value Ref Range   Glucose, UA NEGATIVE NEGATIVE mg/dL   Bilirubin Urine NEGATIVE NEGATIVE   Ketones, ur TRACE (A) NEGATIVE mg/dL   Specific Gravity, Urine >=1.030 1.005 - 1.030   Hgb urine dipstick LARGE (A) NEGATIVE   pH 6.0 5.0 - 8.0   Protein, ur 100 (A) NEGATIVE mg/dL   Urobilinogen, UA 0.2 0.0 - 1.0 mg/dL   Nitrite NEGATIVE NEGATIVE   Leukocytes, UA SMALL (A) NEGATIVE  Urine cytology ancillary only  Result Value Ref Range   Chlamydia Negative    Neisseria gonorrhea Negative    Trichomonas Negative   Urine cytology ancillary only  Result Value Ref Range   Bacterial vaginitis (A)     **POSITIVE for Atopobium vaginae, POSITIVE for Megasphaera 1, POSITIVE for Gardnerella vaginalis, POSITIVE for BVAB2**   Candida vaginitis Negative for Candida Vaginitis Microorganisms     Labs Reviewed - No data to display  No results found.     ASSESSMENT & PLAN:  1. Bacterial vaginitis     Meds ordered this encounter  Medications  . fluconazole (DIFLUCAN) 200 MG tablet    Sig: Take once today. Take second pill at completion of antibiotics.    Dispense:  2 tablet    Refill:  0  . metroNIDAZOLE (FLAGYL) 500 MG tablet    Sig: Take 1 tablet (500 mg total) by mouth 2 (two) times daily.    Dispense:  14 tablet    Refill:  0    Reviewed expectations re: course of current medical issues. Questions answered. Outlined signs and symptoms indicating need for more acute intervention. Patient verbalized understanding. After Visit Summary given.    Procedures:      Robyn Haber, MD 12/08/17 1709

## 2017-12-08 NOTE — ED Triage Notes (Signed)
Pt was diagnosed with BV at the beginning of April.  Pt was prescribed medication but she states she dropped them in the toilet and never got to take her medicine.  Pt states she is still having symptoms today.

## 2017-12-21 ENCOUNTER — Ambulatory Visit: Payer: Medicaid Other | Admitting: Family Medicine

## 2018-01-05 ENCOUNTER — Encounter (HOSPITAL_COMMUNITY): Payer: Self-pay | Admitting: Emergency Medicine

## 2018-01-05 ENCOUNTER — Ambulatory Visit (HOSPITAL_COMMUNITY)
Admission: EM | Admit: 2018-01-05 | Discharge: 2018-01-05 | Disposition: A | Discharge status: Home / Self Care | Payer: Medicaid Other | Attending: Emergency Medicine | Admitting: Emergency Medicine

## 2018-01-05 DIAGNOSIS — H00014 Hordeolum externum left upper eyelid: Secondary | ICD-10-CM

## 2018-01-05 MED ORDER — TETRACAINE HCL 0.5 % OP SOLN
OPHTHALMIC | Status: AC
  Filled 2018-01-05: qty 4

## 2018-01-05 NOTE — Discharge Instructions (Signed)
Stye  -Warm compresses multiple times a day with massage afterward  - Do not wear contacts or make up  - Keep lid clean, may use baby shampoo  - Return if increasing, redness, pain or swelling, decreased vision.

## 2018-01-05 NOTE — ED Triage Notes (Addendum)
PT reports "scab" on inside of left eyelid. PT's eye was stuck closed with drainage this AM.

## 2018-01-05 NOTE — ED Provider Notes (Signed)
[place]    CSN: [id number] Arrival date & time: 01/05/18  1615     History   Chief Complaint Chief Complaint  Patient presents with  . Eye Problem    HPI Suzanne Campbell is a 32 y.o. female no contributing past medical history presenting today for evaluation of her left eye.  She states that she has had swelling in her left eye over the past 2 days off and on, she has been applying cold compresses which have helped the swelling go down, she is noticed a scab on the inside of her eye today, she has felt that this Is irritating and scratching her eye itself.  Overall her symptoms have been I am proving.  Denies wearing contacts.  Denies vision changes or eye pain.  HPI  Past Medical History:  Diagnosis Date  . 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 Family history of colon cancer    mother  . Family history of stomach cancer    MGF    Patient Active Problem List   Diagnosis Date Noted  . Dysmenorrhea 12/25/2016  . Genetic testing 12/28/2015  . Family history of colon cancer   . Family history of breast cancer   . Family history of stomach cancer   . Overweight (BMI 25.0-29.9) 03/24/2014  . Family history of cancer 03/24/2014  . Family history of colon cancer in mother 03/24/2014  . Excessive cerumen in both ear canals 03/24/2014    Past Surgical History:  Procedure Laterality Date  . CESAREAN SECTION  2007   x 4 children   . TUBAL LIGATION  2013    OB History    Gravida  4   Para  4   Term  4   Preterm      AB      Living  4     SAB      TAB      Ectopic      Multiple      Live Births               Home Medications    Prior to Admission medications   Medication Sig Start Date End Date Taking? Authorizing Provider  fluconazole (DIFLUCAN) 200 MG tablet Take once today. Take second pill at completion of antibiotics. 12/08/17   Robyn Haber, MD  metroNIDAZOLE (FLAGYL) 500 MG tablet Take 1 tablet (500 mg  total) by mouth 2 (two) times daily. 12/08/17   Robyn Haber, MD    Family History Family History  Problem Relation Age of Onset  . Colon cancer Mother 67  . Stomach cancer Maternal Grandfather        dx in his 64s-60s  . Breast cancer Maternal Aunt 30       possible BRCA testing  . Breast cancer Maternal Aunt 42  . Diabetes Neg Hx   . Heart disease Neg Hx   . Hypertension Neg Hx   . Rectal cancer Neg Hx     Social History Social History   Tobacco Use  . Smoking status: Former Smoker    Packs/day: 0.50    Years: 5.00    Pack years: 2.50    Types: Cigars    Last attempt to quit: 08/04/2008    Years since quitting: 9.4  . Smokeless tobacco: Never Used  . Tobacco comment: cigars - 4 cigars/pk  Substance Use Topics  . Alcohol use: Yes    Alcohol/week: 4.8  oz    Types: 8 Glasses of wine per week  . Drug use: No     Allergies   Penicillins   Review of Systems Review of Systems  Constitutional: Negative for chills, fatigue and fever.  HENT: Negative for congestion, ear pain, rhinorrhea, sinus pressure, sore throat and trouble swallowing.   Eyes: Positive for discharge. Negative for photophobia, pain, redness, itching and visual disturbance.  Respiratory: Negative for cough, chest tightness and shortness of breath.   Cardiovascular: Negative for chest pain.  Gastrointestinal: Negative for abdominal pain, nausea and vomiting.  Musculoskeletal: Negative for myalgias.  Skin: Negative for rash.  Neurological: Negative for dizziness, light-headedness and headaches.     Physical Exam Triage Vital Signs ED Triage Vitals  Enc Vitals Group     BP 01/05/18 1632 100/61     Pulse Rate 01/05/18 1632 86     Resp 01/05/18 1632 16     Temp 01/05/18 1632 98.3 F (36.8 C)     Temp Source 01/05/18 1632 Oral     SpO2 01/05/18 1632 100 %     Weight 01/05/18 1631 210 lb (95.3 kg)     Height --      Head Circumference --      Peak Flow --      Pain Score 01/05/18 1631 2      Pain Loc --      Pain Edu? --      Excl. in [place]? --    No data found.  Updated Vital Signs BP 100/61   Pulse 86   Temp 98.3 F (36.8 C) (Oral)   Resp 16   Wt 210 lb (95.3 kg)   LMP 01/01/2018   SpO2 100%   BMI 34.95 kg/m   Visual Acuity Right Eye Distance:  20/20 Left Eye Distance:  20/20 Bilateral Distance:  20/20  Right Eye Near:   Left Eye Near:    Bilateral Near:     Physical Exam  Constitutional: She appears well-developed and well-nourished. No distress.  HENT:  Head: Normocephalic and atraumatic.  Eyes: Pupils are equal, round, and reactive to light. Conjunctivae and EOM are normal.  No obvious swelling or redness to bilateral eyelids, small scab area to medial canthus of left eye, mild swelling around this area.  Conjunctiva without erythema.  Patient would occasionally tear up with light shining in her eye, red reflex present.  Neck: Neck supple.  Cardiovascular: Normal rate and regular rhythm.  No murmur heard. Pulmonary/Chest: Effort normal and breath sounds normal. No respiratory distress.  Abdominal: Soft. There is no tenderness.  Musculoskeletal: She exhibits no edema.  Neurological: She is alert.  Skin: Skin is warm and dry.  Psychiatric: She has a normal mood and affect.  Nursing note and vitals reviewed.    UC Treatments / Results  Labs (all labs ordered are listed, but only abnormal results are displayed) Labs Reviewed - No data to display  EKG None  Radiology No results found.  Procedures Procedures (including critical care time)  Medications Ordered in UC Medications - No data to display  Initial Impression / Assessment and Plan / UC Course  I have reviewed the triage vital signs and the nursing notes.  Pertinent labs & imaging results that were available during my care of the patient were reviewed by me and considered in my medical decision making (see chart for details).     Patient with what appears to be healing  hordeolum/stye.  Will have patient continue  to do warm and cold compresses, lid scrubs.  Visual acuity does not appear affected.  Does not appear to have been an infectious process.  Will continue symptomatic management and monitoring.Discussed strict return precautions. Patient verbalized understanding and is agreeable with plan.  Final Clinical Impressions(s) / UC Diagnoses   Final diagnoses:  Hordeolum externum of left upper eyelid     Discharge Instructions     Stye  -Warm compresses multiple times a day with massage afterward  - Do not wear contacts or make up  - Keep lid clean, may use baby shampoo  - Return if increasing, redness, pain or swelling, decreased vision.     ED Prescriptions    None     Controlled Substance Prescriptions [place] Controlled Substance Registry consulted? Not Applicable   Janith Lima, [place] 01/05/18 1705

## 2018-01-18 ENCOUNTER — Ambulatory Visit (HOSPITAL_COMMUNITY)
Admission: EM | Admit: 2018-01-18 | Discharge: 2018-01-18 | Disposition: A | Discharge status: Home / Self Care | Payer: Medicaid Other | Attending: Internal Medicine | Admitting: Internal Medicine

## 2018-01-18 ENCOUNTER — Encounter (HOSPITAL_COMMUNITY): Payer: Self-pay | Admitting: Emergency Medicine

## 2018-01-18 DIAGNOSIS — B9689 Other specified bacterial agents as the cause of diseases classified elsewhere: Secondary | ICD-10-CM

## 2018-01-18 DIAGNOSIS — N760 Acute vaginitis: Secondary | ICD-10-CM | POA: Diagnosis not present

## 2018-01-18 LAB — POCT URINALYSIS DIP (DEVICE)
BILIRUBIN URINE: NEGATIVE
Glucose, UA: NEGATIVE mg/dL
KETONES UR: NEGATIVE mg/dL
Leukocytes, UA: NEGATIVE
Nitrite: NEGATIVE
PH: 6 (ref 5.0–8.0)
Protein, ur: 30 mg/dL — AB
SPECIFIC GRAVITY, URINE: 1.025 (ref 1.005–1.030)
Urobilinogen, UA: 0.2 mg/dL (ref 0.0–1.0)

## 2018-01-18 LAB — POCT PREGNANCY, URINE: PREG TEST UR: NEGATIVE

## 2018-01-18 MED ORDER — FLUCONAZOLE 150 MG PO TABS: 150.0000 mg | ORAL_TABLET | ORAL | 0 refills | Status: DC

## 2018-01-18 MED ORDER — METRONIDAZOLE 500 MG PO TABS: 500.0000 mg | ORAL_TABLET | Freq: Two times a day (BID) | ORAL | 0 refills | Status: DC

## 2018-01-18 NOTE — ED Triage Notes (Signed)
Pt sts vaginal pain and irritation with hx of BV

## 2018-01-18 NOTE — ED Provider Notes (Signed)
  MRN: [id number] DOB: 06/13/1986  Subjective:   Suzanne Campbell is a 32 y.o. female presenting for persistent vaginal discharge, vaginal itching outer vaginal swelling.  Patient reports that she was seen approximately 1 month ago and tested positive for bacterial vaginosis and had empiric treatment for yeast infection.  Patient reports that she and only took Diflucan and did not feel the prescription for Flagyl.  Today she reports that her symptoms are ongoing and would like to have the prescription for Flagyl.  She is not interested in having retesting.  No current facility-administered medications for this encounter.   Current Outpatient Medications:  .  fluconazole (DIFLUCAN) 200 MG tablet, Take once today. Take second pill at completion of antibiotics. (Patient not taking: Reported on 01/18/2018), Disp: 2 tablet, Rfl: 0 .  metroNIDAZOLE (FLAGYL) 500 MG tablet, Take 1 tablet (500 mg total) by mouth 2 (two) times daily. (Patient not taking: Reported on 01/18/2018), Disp: 14 tablet, Rfl: 0   Allergies  Allergen Reactions  . Penicillins Hives    Past Medical History:  Diagnosis Date  . Family history of breast cancer    mother's sister Midwife(Aunt)  . Family history of colon cancer    mother  . Family history of stomach cancer    MGF     Past Surgical History:  Procedure Laterality Date  . CESAREAN SECTION  2007   x 4 children   . TUBAL LIGATION  2013    Objective:   Vitals: BP 127/77 (BP Location: Left Arm)   Pulse 81   Temp 97.9 F (36.6 C) (Oral)   Resp 18   LMP 01/01/2018   SpO2 100%   Physical Exam  Constitutional: She is oriented to person, place, and time. She appears well-developed and well-nourished.  Cardiovascular: Normal rate.  Pulmonary/Chest: Effort normal.  Neurological: She is alert and oriented to person, place, and time.    Results for orders placed or performed during the hospital encounter of 01/18/18 (from the past 24 hour(s))  POCT urinalysis dip (device)      Status: Abnormal   Collection Time: 01/18/18 12:14 PM  Result Value Ref Range   Glucose, UA NEGATIVE NEGATIVE mg/dL   Bilirubin Urine NEGATIVE NEGATIVE   Ketones, ur NEGATIVE NEGATIVE mg/dL   Specific Gravity, Urine 1.025 1.005 - 1.030   Hgb urine dipstick SMALL (A) NEGATIVE   pH 6.0 5.0 - 8.0   Protein, ur 30 (A) NEGATIVE mg/dL   Urobilinogen, UA 0.2 0.0 - 1.0 mg/dL   Nitrite NEGATIVE NEGATIVE   Leukocytes, UA NEGATIVE NEGATIVE  Pregnancy, urine POC     Status: None   Collection Time: 01/18/18 12:19 PM  Result Value Ref Range   Preg Test, Ur NEGATIVE NEGATIVE    Assessment and Plan :   Vaginitis and vulvovaginitis  Bacterial vaginosis  Provide a prescription for Flagyl to cover for bacterial vaginosis.  Patient refused retesting.  Follow-up as needed.   Wallis BambergMani, Belkis Norbeck, PA-C 01/18/18 1441

## 2018-02-28 ENCOUNTER — Ambulatory Visit (HOSPITAL_COMMUNITY)
Admission: EM | Admit: 2018-02-28 | Discharge: 2018-02-28 | Disposition: A | Discharge status: Home / Self Care | Payer: Medicaid Other | Attending: Internal Medicine | Admitting: Internal Medicine

## 2018-02-28 ENCOUNTER — Encounter (HOSPITAL_COMMUNITY): Payer: Self-pay | Admitting: Emergency Medicine

## 2018-02-28 DIAGNOSIS — Z800 Family history of malignant neoplasm of digestive organs: Secondary | ICD-10-CM | POA: Diagnosis not present

## 2018-02-28 DIAGNOSIS — Z880 Allergy status to penicillin: Secondary | ICD-10-CM | POA: Insufficient documentation

## 2018-02-28 DIAGNOSIS — Z87891 Personal history of nicotine dependence: Secondary | ICD-10-CM | POA: Insufficient documentation

## 2018-02-28 DIAGNOSIS — N898 Other specified noninflammatory disorders of vagina: Secondary | ICD-10-CM | POA: Insufficient documentation

## 2018-02-28 LAB — POCT PREGNANCY, URINE: PREG TEST UR: NEGATIVE

## 2018-02-28 MED ORDER — METRONIDAZOLE 0.75 % VA GEL: 1.0000 | VAGINAL | 2 refills | Status: DC

## 2018-02-28 MED ORDER — FLUCONAZOLE 150 MG PO TABS: 150.0000 mg | ORAL_TABLET | Freq: Once | ORAL | 0 refills | Status: AC

## 2018-02-28 MED ORDER — METRONIDAZOLE 500 MG PO TABS: 500.0000 mg | ORAL_TABLET | Freq: Two times a day (BID) | ORAL | 0 refills | Status: AC

## 2018-02-28 NOTE — Discharge Instructions (Addendum)
Please begin metronidazole twice daily for 1 week, then use gel intravaginally twice weekly at bedtime. Do not drink alcohol until 24 hours after taking last tablet. 1 Tablet of diflucan for yeast today. May repeat second tablet at end of metronidazle course.  We are testing you for Gonorrhea, Chlamydia, Trichomonas, Yeast and Bacterial Vaginosis. We will call you if anything is positive and let you know if you require any further treatment. Please inform partners of any positive results.   Please return if symptoms not improving with treatment, development of fever, nausea, vomiting, abdominal pain.

## 2018-02-28 NOTE — ED Provider Notes (Signed)
[place]    CSN: [id number] Arrival date & time: 02/28/18  1018     History   Chief Complaint Chief Complaint  Patient presents with  . Vaginal Discharge    HPI Suzanne Campbell is a 32 y.o. female.   No contributing past medical history presenting today for evaluation of vaginal discharge, itching and irritation.  Patient symptoms have been going on for approximately 1 week.  Patient states she has history of recurrent BV as well as yeast infections.  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  She feels her symptoms have been off and on every 2 to 3 months since starting this job.  She denies any new partners.  Denies any pelvic pain or abdominal pain.  Denies any fevers, nausea, vomiting.  She has tried changing underwear and soaps without improvement of symptoms.     Past Medical History:  Diagnosis Date  . 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 Family history of colon cancer    mother  . Family history of stomach cancer    MGF    Patient Active Problem List   Diagnosis Date Noted  . Dysmenorrhea 12/25/2016  . Genetic testing 12/28/2015  . Family history of colon cancer   . Family history of breast cancer   . Family history of stomach cancer   . Overweight (BMI 25.0-29.9) 03/24/2014  . Family history of cancer 03/24/2014  . Family history of colon cancer in mother 03/24/2014  . Excessive cerumen in both ear canals 03/24/2014    Past Surgical History:  Procedure Laterality Date  . CESAREAN SECTION  2007   x 4 children   . TUBAL LIGATION  2013    OB History    Gravida  4   Para  4   Term  4   Preterm      AB      Living  4     SAB      TAB      Ectopic      Multiple      Live Births               Home Medications    Prior to Admission medications   Medication Sig Start Date End Date Taking? Authorizing Provider   fluconazole (DIFLUCAN) 150 MG tablet Take 1 tablet (150 mg total) by mouth once for 1 dose. 02/28/18 02/28/18  Wieters, Hallie C, PA-C  metroNIDAZOLE (FLAGYL) 500 MG tablet Take 1 tablet (500 mg total) by mouth 2 (two) times daily for 7 days. 02/28/18 03/07/18  Wieters, Hallie C, PA-C  metroNIDAZOLE (METROGEL) 0.75 % vaginal gel Place 1 Applicatorful vaginally 2 (two) times a week. 03/01/18   Wieters, Elesa Hacker, PA-C    Family History Family History  Problem Relation Age of Onset  . Colon cancer Mother 90  . Stomach cancer Maternal Grandfather        dx in his 42s-60s  . Breast cancer Maternal Aunt 30       possible BRCA testing  . Breast cancer Maternal Aunt 42  . Diabetes Neg Hx   . Heart disease Neg Hx   . Hypertension Neg Hx   . Rectal cancer Neg Hx     Social History Social History   Tobacco Use  . Smoking status: Former Smoker    Packs/day:  0.50    Years: 5.00    Pack years: 2.50    Types: Cigars    Last attempt to quit: 08/04/2008    Years since quitting: 9.5  . Smokeless tobacco: Never Used  . Tobacco comment: cigars - 4 cigars/pk  Substance Use Topics  . Alcohol use: Yes    Alcohol/week: 4.8 oz    Types: 8 Glasses of wine per week  . Drug use: No     Allergies   Penicillins   Review of Systems Review of Systems  Constitutional: Negative for fever.  Respiratory: Negative for shortness of breath.   Cardiovascular: Negative for chest pain.  Gastrointestinal: Negative for abdominal pain, diarrhea, nausea and vomiting.  Genitourinary: Positive for vaginal discharge. Negative for dysuria, flank pain, genital sores, hematuria, menstrual problem, vaginal bleeding and vaginal pain.  Musculoskeletal: Negative for back pain.  Skin: Negative for rash.  Neurological: Negative for dizziness, light-headedness and headaches.     Physical Exam Triage Vital Signs ED Triage Vitals  Enc Vitals Group     BP 02/28/18 1116 118/73     Pulse Rate 02/28/18 1116 76     Resp  02/28/18 1116 18     Temp 02/28/18 1116 98 F (36.7 C)     Temp src --      SpO2 02/28/18 1116 100 %     Weight --      Height --      Head Circumference --      Peak Flow --      Pain Score 02/28/18 1158 2     Pain Loc --      Pain Edu? --      Excl. in [place]? --    No data found.  Updated Vital Signs BP 118/73   Pulse 76   Temp 98 F (36.7 C)   Resp 18   LMP 02/14/2018   SpO2 100%   Visual Acuity Right Eye Distance:   Left Eye Distance:   Bilateral Distance:    Right Eye Near:   Left Eye Near:    Bilateral Near:     Physical Exam  Constitutional: She is oriented to person, place, and time. She appears well-developed and well-nourished.  No acute distress  HENT:  Head: Normocephalic and atraumatic.  Nose: Nose normal.  Eyes: Conjunctivae are normal.  Neck: Neck supple.  Cardiovascular: Normal rate.  Pulmonary/Chest: Effort normal. No respiratory distress.  Abdominal: She exhibits no distension.  Nontender to light deep palpation throughout all 4 quadrants  Genitourinary:  Genitourinary Comments: Small folliculitis bump to mons  Musculoskeletal: Normal range of motion.  Neurological: She is alert and oriented to person, place, and time.  Skin: Skin is warm and dry.  Psychiatric: She has a normal mood and affect.  Nursing note and vitals reviewed.    UC Treatments / Results  Labs (all labs ordered are listed, but only abnormal results are displayed) Labs Reviewed  POCT PREGNANCY, URINE  CERVICOVAGINAL ANCILLARY ONLY    EKG None  Radiology No results found.  Procedures Procedures (including critical care time)  Medications Ordered in UC Medications - No data to display  Initial Impression / Assessment and Plan / UC Course  I have reviewed the triage vital signs and the nursing notes.  Pertinent labs & imaging results that were available during my care of the patient were reviewed by me and considered in my medical decision making (see chart  for details).     Patient with  vaginal discharge, will empirically treat for yeast and BV given history.  Vaginal swab obtained, will call patient with results and alter treatment as needed.  Also provided MetroGel to be used at bedtime twice weekly as a trial to see if this helps recurrent symptoms.Discussed strict return precautions. Patient verbalized understanding and is agreeable with plan.  Final Clinical Impressions(s) / UC Diagnoses   Final diagnoses:  Vaginal discharge     Discharge Instructions     Please begin metronidazole twice daily for 1 week, then use gel intravaginally twice weekly at bedtime. Do not drink alcohol until 24 hours after taking last tablet. 1 Tablet of diflucan for yeast today. May repeat second tablet at end of metronidazle course.  We are testing you for Gonorrhea, Chlamydia, Trichomonas, Yeast and Bacterial Vaginosis. We will call you if anything is positive and let you know if you require any further treatment. Please inform partners of any positive results.   Please return if symptoms not improving with treatment, development of fever, nausea, vomiting, abdominal pain.    ED Prescriptions    Medication Sig Dispense Auth. Provider   metroNIDAZOLE (FLAGYL) 500 MG tablet Take 1 tablet (500 mg total) by mouth 2 (two) times daily for 7 days. 14 tablet Wieters, Hallie C, PA-C   fluconazole (DIFLUCAN) 150 MG tablet Take 1 tablet (150 mg total) by mouth once for 1 dose. 2 tablet Wieters, Hallie C, PA-C   metroNIDAZOLE (METROGEL) 0.75 % vaginal gel Place 1 Applicatorful vaginally 2 (two) times a week. 70 g Wieters, [place] C, PA-C     Controlled Substance Prescriptions [place] Controlled Substance Registry consulted? Not Applicable   Janith Lima, [place] 02/28/18 1310

## 2018-02-28 NOTE — ED Triage Notes (Signed)
Pt c/o vaginal irritation and swelling with some discharge. Pt hx of BV and yeast, wants refill on medication.

## 2018-03-01 LAB — CERVICOVAGINAL ANCILLARY ONLY
Bacterial vaginitis: NEGATIVE
Candida vaginitis: POSITIVE — AB
Chlamydia: NEGATIVE
Neisseria Gonorrhea: NEGATIVE
Trichomonas: NEGATIVE

## 2018-05-04 ENCOUNTER — Encounter (HOSPITAL_COMMUNITY): Payer: Self-pay | Admitting: Emergency Medicine

## 2018-05-04 ENCOUNTER — Ambulatory Visit (HOSPITAL_COMMUNITY)
Admission: EM | Admit: 2018-05-04 | Discharge: 2018-05-04 | Disposition: A | Discharge status: Home / Self Care | Payer: Medicaid Other | Attending: Family Medicine | Admitting: Family Medicine

## 2018-05-04 ENCOUNTER — Other Ambulatory Visit: Payer: Self-pay

## 2018-05-04 DIAGNOSIS — N898 Other specified noninflammatory disorders of vagina: Secondary | ICD-10-CM | POA: Diagnosis not present

## 2018-05-04 DIAGNOSIS — N760 Acute vaginitis: Secondary | ICD-10-CM | POA: Diagnosis not present

## 2018-05-04 DIAGNOSIS — S90112A Contusion of left great toe without damage to nail, initial encounter: Secondary | ICD-10-CM | POA: Diagnosis not present

## 2018-05-04 DIAGNOSIS — Z3202 Encounter for pregnancy test, result negative: Secondary | ICD-10-CM

## 2018-05-04 LAB — POCT URINALYSIS DIP (DEVICE)
BILIRUBIN URINE: NEGATIVE
Glucose, UA: NEGATIVE mg/dL
Ketones, ur: NEGATIVE mg/dL
Nitrite: NEGATIVE
Protein, ur: NEGATIVE mg/dL
Urobilinogen, UA: 0.2 mg/dL (ref 0.0–1.0)
pH: 6.5 (ref 5.0–8.0)

## 2018-05-04 LAB — POCT PREGNANCY, URINE: Preg Test, Ur: NEGATIVE

## 2018-05-04 MED ORDER — METRONIDAZOLE 500 MG PO TABS
500.0000 mg | ORAL_TABLET | Freq: Once | ORAL | Status: AC
  Administered 2018-05-04: 500 mg via ORAL

## 2018-05-04 MED ORDER — METRONIDAZOLE 500 MG PO TABS: 500.0000 mg | ORAL_TABLET | Freq: Two times a day (BID) | ORAL | 0 refills | Status: DC

## 2018-05-04 MED ORDER — FLUCONAZOLE 150 MG PO TABS: 150.0000 mg | ORAL_TABLET | Freq: Once | ORAL | 1 refills | Status: AC

## 2018-05-04 MED ORDER — METRONIDAZOLE 500 MG PO TABS
ORAL_TABLET | ORAL | Status: AC
  Filled 2018-05-04: qty 1

## 2018-05-04 NOTE — Discharge Instructions (Addendum)
Continue buddy taping the left great toe for the next few days.  You should see improvement and resolution over the next 5 days.  Please return if your vaginitis does not resolve in the next 5 days.

## 2018-05-04 NOTE — ED Provider Notes (Signed)
[place]    CSN: [id number] Arrival date & time: 05/04/18  1904     History   Chief Complaint Chief Complaint  Patient presents with  . Toe Injury    left  . vaginal irritation    HPI RAEGHAN DEMETER is a 32 y.o. female.   Pt reports vaginal itching, burning and discharge x3 days.  She has had these symptoms multiple times in the past.  She is not worried about STD because she is not sexually active.  She said no fever and no unusual bleeding.  She also reports tripping at work today and injuring her left great toe.  She stubbed her toe while she was at work at [employer].  She is sure he did not break but wanted to see if we had anything that she could use to splint the toe or keep it from moving.     Past Medical History:  Diagnosis Date  . 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 Family history of colon cancer    mother  . Family history of stomach cancer    MGF    Patient Active Problem List   Diagnosis Date Noted  . Dysmenorrhea 12/25/2016  . Genetic testing 12/28/2015  . Family history of colon cancer   . Family history of breast cancer   . Family history of stomach cancer   . Overweight (BMI 25.0-29.9) 03/24/2014  . Family history of cancer 03/24/2014  . Family history of colon cancer in mother 03/24/2014  . Excessive cerumen in both ear canals 03/24/2014    Past Surgical History:  Procedure Laterality Date  . CESAREAN SECTION  2007   x 4 children   . TUBAL LIGATION  2013    OB History    Gravida  4   Para  4   Term  4   Preterm      AB      Living  4     SAB      TAB      Ectopic      Multiple      Live Births               Home Medications    Prior to Admission medications   Medication Sig Start Date End Date Taking? Authorizing Provider  fluconazole (DIFLUCAN) 150 MG tablet Take 1 tablet (150 mg total) by mouth once for 1 dose. Repeat if needed 05/04/18 05/04/18  Robyn Haber, MD    metroNIDAZOLE (FLAGYL) 500 MG tablet Take 1 tablet (500 mg total) by mouth 2 (two) times daily. 05/04/18   Robyn Haber, MD    Family History Family History  Problem Relation Age of Onset  . Colon cancer Mother 68  . Stomach cancer Maternal Grandfather        dx in his 6s-60s  . Breast cancer Maternal Aunt 30       possible BRCA testing  . Breast cancer Maternal Aunt 42  . Diabetes Neg Hx   . Heart disease Neg Hx   . Hypertension Neg Hx   . Rectal cancer Neg Hx     Social History Social History   Tobacco Use  . Smoking status: Former Smoker    Packs/day: 0.50    Years: 5.00    Pack years: 2.50    Types: Cigars    Last attempt to quit: 08/04/2008    Years since quitting: 9.7  . Smokeless tobacco:  Never Used  . Tobacco comment: cigars - 4 cigars/pk  Substance Use Topics  . Alcohol use: Yes    Alcohol/week: 8.0 standard drinks    Types: 8 Glasses of wine per week  . Drug use: No     Allergies   Penicillins   Review of Systems Review of Systems   Physical Exam Triage Vital Signs ED Triage Vitals [05/04/18 1935]  Enc Vitals Group     BP 119/67     Pulse Rate 80     Resp      Temp 98.1 F (36.7 C)     Temp Source Oral     SpO2 100 %     Weight      Height      Head Circumference      Peak Flow      Pain Score 6     Pain Loc      Pain Edu?      Excl. in [place]?    No data found.  Updated Vital Signs BP 119/67 (BP Location: Left Arm)   Pulse 80   Temp 98.1 F (36.7 C) (Oral)   LMP 04/04/2018 (Exact Date)   SpO2 100%   Visual Acuity Right Eye Distance:   Left Eye Distance:   Bilateral Distance:    Right Eye Near:   Left Eye Near:    Bilateral Near:     Physical Exam   UC Treatments / Results  Labs (all labs ordered are listed, but only abnormal results are displayed) Labs Reviewed  POCT URINALYSIS DIP (DEVICE) - Abnormal; Notable for the following components:      Result Value   Hgb urine dipstick TRACE (*)    Leukocytes, UA TRACE  (*)    All other components within normal limits  POCT PREGNANCY, URINE    EKG None  Radiology No results found.  Procedures Procedures (including critical care time)  Medications Ordered in UC Medications  metroNIDAZOLE (FLAGYL) tablet 500 mg (has no administration in time range)    Initial Impression / Assessment and Plan / UC Course  I have reviewed the triage vital signs and the nursing notes.  Pertinent labs & imaging results that were available during my care of the patient were reviewed by me and considered in my medical decision making (see chart for details).     Final Clinical Impressions(s) / UC Diagnoses   Final diagnoses:  Vaginitis and vulvovaginitis  Contusion of left great toe without damage to nail, initial encounter     Discharge Instructions     Continue buddy taping the left great toe for the next few days.  You should see improvement and resolution over the next 5 days.  Please return if your vaginitis does not resolve in the next 5 days.    ED Prescriptions    Medication Sig Dispense Auth. Provider   metroNIDAZOLE (FLAGYL) 500 MG tablet Take 1 tablet (500 mg total) by mouth 2 (two) times daily. 14 tablet Robyn Haber, MD   fluconazole (DIFLUCAN) 150 MG tablet Take 1 tablet (150 mg total) by mouth once for 1 dose. Repeat if needed 2 tablet Robyn Haber, MD     Controlled Substance Prescriptions [place] Controlled Substance Registry consulted? Not Applicable   Robyn Haber, MD 05/04/18 [phone]

## 2018-05-04 NOTE — ED Triage Notes (Signed)
Pt reports vaginal itching, burning and discharge x3 days.  She also reports tripping at work today and injuring her left great toe.

## 2018-07-21 ENCOUNTER — Ambulatory Visit (HOSPITAL_COMMUNITY)
Admission: EM | Admit: 2018-07-21 | Discharge: 2018-07-21 | Discharge status: Against Medical Advice | Payer: Medicaid Other

## 2018-07-21 NOTE — ED Notes (Signed)
Called x3 for room. No answer, no one outside of building

## 2019-11-11 ENCOUNTER — Encounter (HOSPITAL_COMMUNITY): Payer: Self-pay

## 2019-11-11 ENCOUNTER — Ambulatory Visit (HOSPITAL_COMMUNITY)
Admission: EM | Admit: 2019-11-11 | Discharge: 2019-11-11 | Disposition: A | Discharge status: Home / Self Care | Payer: Medicaid Other | Attending: Family Medicine | Admitting: Family Medicine

## 2019-11-11 ENCOUNTER — Other Ambulatory Visit: Payer: Self-pay

## 2019-11-11 DIAGNOSIS — N760 Acute vaginitis: Secondary | ICD-10-CM | POA: Insufficient documentation

## 2019-11-11 LAB — POCT URINALYSIS DIP (DEVICE)
Bilirubin Urine: NEGATIVE
Glucose, UA: NEGATIVE mg/dL
Ketones, ur: NEGATIVE mg/dL
Nitrite: NEGATIVE
Protein, ur: NEGATIVE mg/dL
Specific Gravity, Urine: 1.025 (ref 1.005–1.030)
Urobilinogen, UA: 0.2 mg/dL (ref 0.0–1.0)
pH: 5.5 (ref 5.0–8.0)

## 2019-11-11 MED ORDER — FLUCONAZOLE 150 MG PO TABS: 150.0000 mg | ORAL_TABLET | Freq: Every day | ORAL | 0 refills | Status: DC

## 2019-11-11 MED ORDER — NYSTATIN-TRIAMCINOLONE 100000-0.1 UNIT/GM-% EX CREA: TOPICAL_CREAM | CUTANEOUS | 0 refills | Status: DC

## 2019-11-11 NOTE — ED Triage Notes (Signed)
Pt c/o dysuria, vaginal swelling, vaginal itching, vaginal irritationx3-4 days. Pt states having small amount whitish/yellowish color. Pt denies any other urinary symptoms.

## 2019-11-11 NOTE — Discharge Instructions (Addendum)
Treating you for yeast infection.  You can apply the cream twice a day as needed. Fluconazole take 1 pill now and 1 pill in 3 days of still having symptoms. We are sending a swab for STD testing and we will call you with any positive results

## 2019-11-12 LAB — URINE CULTURE: Culture: NO GROWTH

## 2019-11-13 NOTE — ED Provider Notes (Signed)
[place]    CSN: [id number] Arrival date & time: 11/11/19  1044      History   Chief Complaint Chief Complaint  Patient presents with  . Dysuria    HPI Suzanne Campbell is a 34 y.o. female.   Patient is a 34 year old female that presents today for dysuria, vaginal swelling, vaginal itching and irritation x3 to 4 days.  She is also has some associated white/yellow discharge.  Symptoms been constant.  No abdominal pain, back pain, fevers, nausea, vomiting.No LMP recorded.  ROS per HPI      Past Medical History:  Diagnosis Date  . 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 Family history of colon cancer    mother  . Family history of stomach cancer    MGF    Patient Active Problem List   Diagnosis Date Noted  . Dysmenorrhea 12/25/2016  . Genetic testing 12/28/2015  . Family history of colon cancer   . Family history of breast cancer   . Family history of stomach cancer   . Overweight (BMI 25.0-29.9) 03/24/2014  . Family history of cancer 03/24/2014  . Family history of colon cancer in mother 03/24/2014  . Excessive cerumen in both ear canals 03/24/2014    Past Surgical History:  Procedure Laterality Date  . CESAREAN SECTION  2007   x 4 children   . TUBAL LIGATION  2013    OB History    Gravida  4   Para  4   Term  4   Preterm      AB      Living  4     SAB      TAB      Ectopic      Multiple      Live Births               Home Medications    Prior to Admission medications   Medication Sig Start Date End Date Taking? Authorizing Provider  Multiple Vitamin (MULTIVITAMIN WITH MINERALS) TABS tablet Take 1 tablet by mouth daily.   Yes [provider]  fluconazole (DIFLUCAN) 150 MG tablet Take 1 tablet (150 mg total) by mouth daily. 11/11/19   Orvan July, NP  nystatin-triamcinolone (MYCOLOG II) cream Apply to affected area daily 11/11/19   Orvan July, NP    Family History Family History   Problem Relation Age of Onset  . Colon cancer Mother 19  . Stomach cancer Maternal Grandfather        dx in his 85s-60s  . Breast cancer Maternal Aunt 30       possible BRCA testing  . Breast cancer Maternal Aunt 42  . Diabetes Neg Hx   . Heart disease Neg Hx   . Hypertension Neg Hx   . Rectal cancer Neg Hx     Social History Social History   Tobacco Use  . Smoking status: Former Smoker    Packs/day: 0.50    Years: 5.00    Pack years: 2.50    Types: Cigars    Quit date: 08/04/2008    Years since quitting: 11.2  . Smokeless tobacco: Never Used  . Tobacco comment: cigars - 4 cigars/pk  Substance Use Topics  . Alcohol use: Yes    Alcohol/week: 8.0 standard drinks    Types: 8 Glasses of wine per week  . Drug use: No     Allergies   Penicillins  Review of Systems Review of Systems   Physical Exam Triage Vital Signs ED Triage Vitals [11/11/19 1137]  Enc Vitals Group     BP 118/83     Pulse Rate 78     Resp 18     Temp 98 F (36.7 C)     Temp Source Oral     SpO2 95 %     Weight 190 lb (86.2 kg)     Height 5' 6"  (1.676 m)     Head Circumference      Peak Flow      Pain Score 6     Pain Loc      Pain Edu?      Excl. in [place]?    No data found.  Updated Vital Signs BP 118/83   Pulse 78   Temp 98 F (36.7 C) (Oral)   Resp 18   Ht 5' 6"  (1.676 m)   Wt 190 lb (86.2 kg)   SpO2 95%   BMI 30.67 kg/m   Visual Acuity Right Eye Distance:   Left Eye Distance:   Bilateral Distance:    Right Eye Near:   Left Eye Near:    Bilateral Near:     Physical Exam Vitals and nursing note reviewed.  Constitutional:      General: She is not in acute distress.    Appearance: Normal appearance. She is not ill-appearing, toxic-appearing or diaphoretic.  HENT:     Head: Normocephalic.     Nose: Nose normal.  Eyes:     Conjunctiva/sclera: Conjunctivae normal.  Pulmonary:     Effort: Pulmonary effort is normal.  Musculoskeletal:        General: Normal range of  motion.     Cervical back: Normal range of motion.  Skin:    General: Skin is warm and dry.     Findings: No rash.  Neurological:     Mental Status: She is alert.  Psychiatric:        Mood and Affect: Mood normal.      UC Treatments / Results  Labs (all labs ordered are listed, but only abnormal results are displayed) Labs Reviewed  POCT URINALYSIS DIP (DEVICE) - Abnormal; Notable for the following components:      Result Value   Hgb urine dipstick TRACE (*)    Leukocytes,Ua SMALL (*)    All other components within normal limits  URINE CULTURE  CERVICOVAGINAL ANCILLARY ONLY    EKG   Radiology No results found.  Procedures Procedures (including critical care time)  Medications Ordered in UC Medications - No data to display  Initial Impression / Assessment and Plan / UC Course  I have reviewed the triage vital signs and the nursing notes.  Pertinent labs & imaging results that were available during my care of the patient were reviewed by me and considered in my medical decision making (see chart for details).     Vaginitis Treating today for yeast infection with Diflucan and Mycolog cream. Sending swab for further testing Final Clinical Impressions(s) / UC Diagnoses   Final diagnoses:  Vaginitis and vulvovaginitis     Discharge Instructions     Treating you for yeast infection.  You can apply the cream twice a day as needed. Fluconazole take 1 pill now and 1 pill in 3 days of still having symptoms. We are sending a swab for STD testing and we will call you with any positive results    ED Prescriptions  Medication Sig Dispense Auth. Provider   nystatin-triamcinolone (MYCOLOG II) cream Apply to affected area daily 15 g Cloyce Paterson A, NP   fluconazole (DIFLUCAN) 150 MG tablet Take 1 tablet (150 mg total) by mouth daily. 2 tablet Loura Halt A, NP     PDMP not reviewed this encounter.   Loura Halt A, NP 11/13/19 1001

## 2019-11-14 LAB — CERVICOVAGINAL ANCILLARY ONLY
Bacterial Vaginitis (gardnerella): POSITIVE — AB
Candida Glabrata: NEGATIVE
Candida Vaginitis: POSITIVE — AB
Chlamydia: NEGATIVE
Comment: NEGATIVE
Comment: NEGATIVE
Comment: NEGATIVE
Comment: NEGATIVE
Comment: NEGATIVE
Comment: NORMAL
Neisseria Gonorrhea: NEGATIVE
Trichomonas: POSITIVE — AB

## 2019-11-15 ENCOUNTER — Telehealth (HOSPITAL_COMMUNITY): Payer: Self-pay

## 2019-11-15 MED ORDER — METRONIDAZOLE 500 MG PO TABS: 500.0000 mg | ORAL_TABLET | Freq: Two times a day (BID) | ORAL | 0 refills | Status: DC

## 2019-12-05 MED ORDER — FLUCONAZOLE 150 MG PO TABS: 150.0000 mg | ORAL_TABLET | Freq: Once | ORAL | 0 refills | Status: AC

## 2019-12-05 NOTE — Telephone Encounter (Signed)
Patient lost her fluconazole tablets and wants it called in to the pharmacy on file.

## 2020-01-30 ENCOUNTER — Other Ambulatory Visit: Payer: Self-pay

## 2020-01-30 ENCOUNTER — Encounter (HOSPITAL_COMMUNITY): Payer: Self-pay

## 2020-01-30 ENCOUNTER — Ambulatory Visit (HOSPITAL_COMMUNITY)
Admission: EM | Admit: 2020-01-30 | Discharge: 2020-01-30 | Disposition: A | Discharge status: Home / Self Care | Payer: Medicaid Other | Attending: Physician Assistant | Admitting: Physician Assistant

## 2020-01-30 DIAGNOSIS — N760 Acute vaginitis: Secondary | ICD-10-CM | POA: Insufficient documentation

## 2020-01-30 DIAGNOSIS — Z3202 Encounter for pregnancy test, result negative: Secondary | ICD-10-CM

## 2020-01-30 LAB — POCT URINALYSIS DIP (DEVICE)
Bilirubin Urine: NEGATIVE
Glucose, UA: NEGATIVE mg/dL
Ketones, ur: NEGATIVE mg/dL
Nitrite: NEGATIVE
Protein, ur: NEGATIVE mg/dL
Specific Gravity, Urine: 1.025 (ref 1.005–1.030)
Urobilinogen, UA: 0.2 mg/dL (ref 0.0–1.0)
pH: 6.5 (ref 5.0–8.0)

## 2020-01-30 LAB — POC URINE PREG, ED: Preg Test, Ur: NEGATIVE

## 2020-01-30 MED ORDER — FLUCONAZOLE 150 MG PO TABS: 150.0000 mg | ORAL_TABLET | Freq: Once | ORAL | 0 refills | Status: AC

## 2020-01-30 MED ORDER — NYSTATIN-TRIAMCINOLONE 100000-0.1 UNIT/GM-% EX CREA: TOPICAL_CREAM | CUTANEOUS | 0 refills | Status: DC

## 2020-01-30 NOTE — ED Triage Notes (Signed)
Pt c/o vaginal itching, rash, discharge after using some oils during intercourse two days ago. Also reports burning with urination.  Denies abdom pain, n/v/d, fever, chills.  No OTC remedies tried.

## 2020-01-30 NOTE — Discharge Instructions (Signed)
We have sent your tests, we will call if we need to change treatments  Take 1 diflucan today, repeat in 3 days if symptoms not improved and no other tests are positive  Try the cream up to 2 times a day for max of 10 days  Use unscented soaps and minimize oils and lubricants  Follow up with your Ob/gyn

## 2020-01-30 NOTE — ED Provider Notes (Signed)
[facility]    CSN: [id number] Arrival date & time: 01/30/20  0801      History   Chief Complaint Chief Complaint  Patient presents with  . Vaginitis    HPI Suzanne Campbell is a 34 y.o. female.   Patient reports for vaginal irritation, itching and discharge.  She reports vaginal irritation started 2 days ago after using a new oil during sex.  She reports pain with use of soap since then.  She also endorses some painful urination.  Denies frequency urgency.  She notes yesterday evening a thick white discharge developing.  She has had persistent itchiness throughout the day since then.  She denies much pain throughout the day other than when washing herself.  She denies any pelvic or lower abdominal pain.  She reports she uses a pink Dove soap.  She had similar issues in the past however she did not have this much irritation.  She reports in April she had similar symptoms and tested positive for trichomonas, bacterial vaginosis and yeast.  She reports she did not complete the last 1-1/2 to 2 days of metronidazole.  She reports all symptoms resolved following that treatment.  Denies fever or chills.     Past Medical History:  Diagnosis Date  . 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 Family history of colon cancer    mother  . Family history of stomach cancer    MGF    Patient Active Problem List   Diagnosis Date Noted  . Dysmenorrhea 12/25/2016  . Genetic testing 12/28/2015  . Family history of colon cancer   . Family history of breast cancer   . Family history of stomach cancer   . Overweight (BMI 25.0-29.9) 03/24/2014  . Family history of cancer 03/24/2014  . Family history of colon cancer in mother 03/24/2014  . Excessive cerumen in both ear canals 03/24/2014    Past Surgical History:  Procedure Laterality Date  . CESAREAN SECTION  2007   x 4 children   . TUBAL LIGATION  2013    OB History    Gravida  4   Para  4   Term  4    Preterm      AB      Living  4     SAB      TAB      Ectopic      Multiple      Live Births               Home Medications    Prior to Admission medications   Medication Sig Start Date End Date Taking? Authorizing Provider  Multiple Vitamin (MULTIVITAMIN WITH MINERALS) TABS tablet Take 1 tablet by mouth daily.   Yes [provider]  fluconazole (DIFLUCAN) 150 MG tablet Take 1 tablet (150 mg total) by mouth once for 1 dose. Take 1 dose today, repeat in 3 days if symptoms persist 01/30/20 01/30/20  Alois Colgan, Marguerita Beards, PA-C  metroNIDAZOLE (FLAGYL) 500 MG tablet Take 1 tablet (500 mg total) by mouth 2 (two) times daily. 11/15/19   Chase Picket, MD  nystatin-triamcinolone (MYCOLOG II) cream Apply to affected area up to 2 times daily for no longer than 10 days 01/30/20   Ily Denno, Marguerita Beards, PA-C    Family History Family History  Problem Relation Age of Onset  . Colon cancer Mother 87  . Stomach cancer Maternal Grandfather  dx in his 59s-60s  . Breast cancer Maternal Aunt 30       possible BRCA testing  . Breast cancer Maternal Aunt 42  . Diabetes Neg Hx   . Heart disease Neg Hx   . Hypertension Neg Hx   . Rectal cancer Neg Hx     Social History Social History   Tobacco Use  . Smoking status: Former Smoker    Packs/day: 0.50    Years: 5.00    Pack years: 2.50    Types: Cigars    Quit date: 08/04/2008    Years since quitting: 11.4  . Smokeless tobacco: Never Used  . Tobacco comment: cigars - 4 cigars/pk  Vaping Use  . Vaping Use: Never used  Substance Use Topics  . Alcohol use: Yes    Alcohol/week: 8.0 standard drinks    Types: 8 Glasses of wine per week  . Drug use: No     Allergies   Penicillins   Review of Systems Review of Systems   Physical Exam Triage Vital Signs ED Triage Vitals  Enc Vitals Group     BP 01/30/20 0816 121/71     Pulse Rate 01/30/20 0816 73     Resp 01/30/20 0816 18     Temp 01/30/20 0816 97.8 F (36.6 C)      Temp Source 01/30/20 0816 Oral     SpO2 01/30/20 0816 100 %     Weight --      Height --      Head Circumference --      Peak Flow --      Pain Score 01/30/20 0814 4     Pain Loc --      Pain Edu? --      Excl. in [place]? --    No data found.  Updated Vital Signs BP 121/71 (BP Location: Left Arm)   Pulse 73   Temp 97.8 F (36.6 C) (Oral)   Resp 18   LMP 01/09/2020   SpO2 100%   Visual Acuity Right Eye Distance:   Left Eye Distance:   Bilateral Distance:    Right Eye Near:   Left Eye Near:    Bilateral Near:     Physical Exam Vitals and nursing note reviewed.  Constitutional:      General: She is not in acute distress.    Appearance: Normal appearance. She is well-developed. She is not ill-appearing.  HENT:     Head: Normocephalic and atraumatic.  Eyes:     Conjunctiva/sclera: Conjunctivae normal.  Cardiovascular:     Rate and Rhythm: Normal rate.  Pulmonary:     Effort: Pulmonary effort is normal. No respiratory distress.  Abdominal:     Palpations: Abdomen is soft.     Tenderness: There is no abdominal tenderness.  Genitourinary:    Comments: Patient defers.  Self swab obtained Musculoskeletal:     Cervical back: Neck supple.  Skin:    General: Skin is warm and dry.  Neurological:     Mental Status: She is alert.      UC Treatments / Results  Labs (all labs ordered are listed, but only abnormal results are displayed) Labs Reviewed  POCT URINALYSIS DIP (DEVICE) - Abnormal; Notable for the following components:      Result Value   Hgb urine dipstick TRACE (*)    Leukocytes,Ua SMALL (*)    All other components within normal limits  URINE CULTURE  POC URINE PREG, ED  CERVICOVAGINAL ANCILLARY ONLY  EKG   Radiology No results found.  Procedures Procedures (including critical care time)  Medications Ordered in UC Medications - No data to display  Initial Impression / Assessment and Plan / UC Course  I have reviewed the triage vital signs  and the nursing notes.  Pertinent labs & imaging results that were available during my care of the patient were reviewed by me and considered in my medical decision making (see chart for details).     #Vaginitis Patient is a 34 year old presenting with vaginitis symptoms.  Urine pregnancy negative.  UA with trace blood and small leuks, likely secondary to vaginal irritation and discharge.  We will send culture however.  Cervicovaginal swab obtained.  Symptoms sound most consistent with yeast, will treat with Diflucan.  Also recommended use of a Mycolog cream for external irritation and to minimize use of scented soaps or other oils.  Patient is open to this.  Instructed patient to follow-up with her OB/GYN if persistent symptoms.  Patient verbalized understanding the plan of care. Final Clinical Impressions(s) / UC Diagnoses   Final diagnoses:  Vaginitis and vulvovaginitis     Discharge Instructions     We have sent your tests, we will call if we need to change treatments  Take 1 diflucan today, repeat in 3 days if symptoms not improved and no other tests are positive  Try the cream up to 2 times a day for max of 10 days  Use unscented soaps and minimize oils and lubricants  Follow up with your Ob/gyn      ED Prescriptions    Medication Sig Dispense Auth. Provider   fluconazole (DIFLUCAN) 150 MG tablet Take 1 tablet (150 mg total) by mouth once for 1 dose. Take 1 dose today, repeat in 3 days if symptoms persist 2 tablet Jamesyn Lindell, Marguerita Beards, PA-C   nystatin-triamcinolone (MYCOLOG II) cream Apply to affected area up to 2 times daily for no longer than 10 days 15 g Eris Hannan, Marguerita Beards, PA-C     PDMP not reviewed this encounter.   Purnell Shoemaker, PA-C 01/30/20 [phone]

## 2020-01-31 LAB — CERVICOVAGINAL ANCILLARY ONLY
Bacterial Vaginitis (gardnerella): NEGATIVE
Candida Glabrata: NEGATIVE
Candida Vaginitis: POSITIVE — AB
Chlamydia: NEGATIVE
Comment: NEGATIVE
Comment: NEGATIVE
Comment: NEGATIVE
Comment: NEGATIVE
Comment: NEGATIVE
Comment: NORMAL
Neisseria Gonorrhea: NEGATIVE
Trichomonas: NEGATIVE

## 2020-01-31 LAB — URINE CULTURE: Culture: 20000 — AB

## 2020-05-28 ENCOUNTER — Ambulatory Visit (HOSPITAL_COMMUNITY)
Admission: EM | Admit: 2020-05-28 | Discharge: 2020-05-28 | Disposition: A | Discharge status: Home / Self Care | Payer: Medicaid Other | Attending: Internal Medicine | Admitting: Internal Medicine

## 2020-05-28 ENCOUNTER — Encounter (HOSPITAL_COMMUNITY): Payer: Self-pay

## 2020-05-28 ENCOUNTER — Other Ambulatory Visit: Payer: Self-pay

## 2020-05-28 DIAGNOSIS — Z202 Contact with and (suspected) exposure to infections with a predominantly sexual mode of transmission: Secondary | ICD-10-CM

## 2020-05-28 LAB — HIV ANTIBODY (ROUTINE TESTING W REFLEX): HIV Screen 4th Generation wRfx: NONREACTIVE

## 2020-05-28 MED ORDER — CEFTRIAXONE SODIUM 500 MG IJ SOLR
500.0000 mg | Freq: Once | INTRAMUSCULAR | Status: AC
  Administered 2020-05-28: 500 mg via INTRAMUSCULAR

## 2020-05-28 MED ORDER — LIDOCAINE HCL (PF) 1 % IJ SOLN
INTRAMUSCULAR | Status: AC
  Filled 2020-05-28: qty 2

## 2020-05-28 MED ORDER — CEFTRIAXONE SODIUM 500 MG IJ SOLR
INTRAMUSCULAR | Status: AC
  Filled 2020-05-28: qty 500

## 2020-05-28 NOTE — ED Provider Notes (Signed)
Suzanne Campbell    CSN: [id number] Arrival date & time: 05/28/20  0808      History   Chief Complaint Chief Complaint  Patient presents with   Exposure to STD    HPI Suzanne Campbell is a 34 y.o. female comes to urgent care to be evaluated after she was informed that her sexual partner has been treated for and is being evaluated for STD.  Patient's partner has penile discharge.  Patient denies any vaginal discharge.  She has deep dyspareunia.  No urgency, frequency or dysuria.  No lower abdominal pain.  Patient recently engaged in unprotected sexual intercourse with her partner. Past Medical History:  Diagnosis Date   Family history of breast cancer    mother's sister (72)   Family history of colon cancer    mother   Family history of stomach cancer    MGF    Patient Active Problem List   Diagnosis Date Noted   Dysmenorrhea 12/25/2016   Genetic testing 12/28/2015   Family history of colon cancer    Family history of breast cancer    Family history of stomach cancer    Overweight (BMI 25.0-29.9) 03/24/2014   Family history of cancer 03/24/2014   Family history of colon cancer in mother 03/24/2014   Excessive cerumen in both ear canals 03/24/2014    Past Surgical History:  Procedure Laterality Date   CESAREAN SECTION  2007   x 4 children    TUBAL LIGATION  2013    OB History    Gravida  4   Para  4   Term  4   Preterm      AB      Living  4     SAB      TAB      Ectopic      Multiple      Live Births               Home Medications    Prior to Admission medications   Not on File    Family History Family History  Problem Relation Age of Onset   Colon cancer Mother 101   Stomach cancer Maternal Grandfather        dx in his 26s-60s   Breast cancer Maternal Aunt 30       possible BRCA testing   Breast cancer Maternal Aunt 42   Diabetes Neg Hx    Heart disease Neg Hx    Hypertension Neg Hx    Rectal  cancer Neg Hx     Social History Social History   Tobacco Use   Smoking status: Former Smoker    Packs/day: 0.50    Years: 5.00    Pack years: 2.50    Types: Cigars    Quit date: 08/04/2008    Years since quitting: 11.8   Smokeless tobacco: Never Used   Tobacco comment: cigars - 4 cigars/pk  Vaping Use   Vaping Use: Never used  Substance Use Topics   Alcohol use: Yes    Alcohol/week: 8.0 standard drinks    Types: 8 Glasses of wine per week   Drug use: No     Allergies   Penicillins   Review of Systems Review of Systems  Cardiovascular: Negative.   Gastrointestinal: Negative.  Negative for abdominal pain.  Genitourinary: Negative for dysuria, frequency, urgency, vaginal bleeding, vaginal discharge and vaginal pain.  Musculoskeletal: Negative.      Physical Exam Triage Vital Signs  ED Triage Vitals  Enc Vitals Group     BP 05/28/20 0832 112/69     Pulse Rate 05/28/20 0832 78     Resp 05/28/20 0832 18     Temp 05/28/20 0832 97.9 F (36.6 C)     Temp Source 05/28/20 0832 Oral     SpO2 05/28/20 0832 96 %     Weight --      Height --      Head Circumference --      Peak Flow --      Pain Score 05/28/20 0830 0     Pain Loc --      Pain Edu? --      Excl. in [place]? --    No data found.  Updated Vital Signs BP 112/69 (BP Location: Right Arm)    Pulse 78    Temp 97.9 F (36.6 C) (Oral)    Resp 18    LMP  (Within Weeks) Comment: 2 weeks   SpO2 96%   Visual Acuity Right Eye Distance:   Left Eye Distance:   Bilateral Distance:    Right Eye Near:   Left Eye Near:    Bilateral Near:     Physical Exam Vitals reviewed.  Cardiovascular:     Pulses: Normal pulses.     Heart sounds: Normal heart sounds.  Pulmonary:     Effort: Pulmonary effort is normal.     Breath sounds: Normal breath sounds.  Abdominal:     General: Bowel sounds are normal.     Palpations: Abdomen is soft.  Musculoskeletal:        General: Normal range of motion.  Skin:     Capillary Refill: Capillary refill takes less than 2 seconds.      UC Treatments / Results  Labs (all labs ordered are listed, but only abnormal results are displayed) Labs Reviewed  HIV ANTIBODY (ROUTINE TESTING W REFLEX)  RPR  CERVICOVAGINAL ANCILLARY ONLY    EKG   Radiology No results found.  Procedures Procedures (including critical care time)  Medications Ordered in UC Medications  cefTRIAXone (ROCEPHIN) injection 500 mg (has no administration in time range)    Initial Impression / Assessment and Plan / UC Course  I have reviewed the triage vital signs and the nursing notes.  Pertinent labs & imaging results that were available during my care of the patient were reviewed by me and considered in my medical decision making (see chart for details).    1.  Exposure to sexually transmitted disease: Cervicovaginal swab for GC/chlamydia/trichomonas Ceftriaxone 500 mg IM HIV/RPR Return precautions given If STD screen is positive, we will update the plan of care. Final Clinical Impressions(s) / UC Diagnoses   Final diagnoses:  STD exposure   Discharge Instructions   None    ED Prescriptions    None     PDMP not reviewed this encounter.   Chase Picket, MD 05/28/20 [phone]

## 2020-05-28 NOTE — ED Triage Notes (Signed)
Pt presents for STD's testing. States her partner was treated yesterday for Gonorrhea and Chlamydia. Pt states having painful intercourse. Deneis vaginal discharge, dysuria.

## 2020-05-28 NOTE — Discharge Instructions (Signed)
Please abstain from sexual intercourse over the next 7 days to allow the medication to work Check your results on MyChart If your results are significant we will give you a call and update your treatment.

## 2020-05-29 LAB — RPR: RPR Ser Ql: NONREACTIVE

## 2020-05-29 LAB — CERVICOVAGINAL ANCILLARY ONLY
Chlamydia: NEGATIVE
Comment: NEGATIVE
Comment: NEGATIVE
Comment: NORMAL
Neisseria Gonorrhea: POSITIVE — AB
Trichomonas: NEGATIVE

## 2020-10-22 ENCOUNTER — Other Ambulatory Visit: Payer: Self-pay

## 2020-10-22 ENCOUNTER — Encounter (HOSPITAL_COMMUNITY): Payer: Self-pay

## 2020-10-22 ENCOUNTER — Ambulatory Visit (HOSPITAL_COMMUNITY)
Admission: EM | Admit: 2020-10-22 | Discharge: 2020-10-22 | Disposition: A | Discharge status: Home / Self Care | Payer: Medicaid Other | Attending: Emergency Medicine | Admitting: Emergency Medicine

## 2020-10-22 DIAGNOSIS — N760 Acute vaginitis: Secondary | ICD-10-CM | POA: Insufficient documentation

## 2020-10-22 LAB — POC URINE PREG, ED: Preg Test, Ur: NEGATIVE

## 2020-10-22 LAB — POCT URINALYSIS DIPSTICK, ED / UC
Bilirubin Urine: NEGATIVE
Glucose, UA: NEGATIVE mg/dL
Ketones, ur: NEGATIVE mg/dL
Nitrite: NEGATIVE
Protein, ur: 30 mg/dL — AB
Specific Gravity, Urine: 1.025 (ref 1.005–1.030)
Urobilinogen, UA: 0.2 mg/dL (ref 0.0–1.0)
pH: 6.5 (ref 5.0–8.0)

## 2020-10-22 MED ORDER — FLUCONAZOLE 150 MG PO TABS: ORAL_TABLET | ORAL | 0 refills | Status: DC

## 2020-10-22 NOTE — ED Triage Notes (Signed)
Pt presents with lower abdominal pain, vaginal itchy, white vaginal discharge x 3 days.   Pt requested STD's testing.

## 2020-10-22 NOTE — ED Provider Notes (Signed)
[facility]    CSN: [id number] Arrival date & time: 10/22/20  6720      History   Chief Complaint Chief Complaint  Patient presents with  . Abdominal Pain  . Vaginal Discharge    HPI Suzanne Campbell is a 35 y.o. female.   Suzanne Campbell presents with complaints of vaginal discharge and itching which started and has worsened over the past two days. 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 She is sexually active with 2 partners and is interested in std screen as well, although no specific known exposure. She has had a tubal ligation. No pelvic pain or vaginal bleeding. Vaginal discharge is thick, white and chunky. No odor. No urinary symptoms.    ROS per HPI, negative if not otherwise mentioned.      Past Medical History:  Diagnosis Date  . 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 Family history of colon cancer    mother  . Family history of stomach cancer    MGF    Patient Active Problem List   Diagnosis Date Noted  . Dysmenorrhea 12/25/2016  . Genetic testing 12/28/2015  . Family history of colon cancer   . Family history of breast cancer   . Family history of stomach cancer   . Overweight (BMI 25.0-29.9) 03/24/2014  . Family history of cancer 03/24/2014  . Family history of colon cancer in mother 03/24/2014  . Excessive cerumen in both ear canals 03/24/2014    Past Surgical History:  Procedure Laterality Date  . CESAREAN SECTION  2007   x 4 children   . TUBAL LIGATION  2013    OB History    Gravida  4   Para  4   Term  4   Preterm      AB      Living  4     SAB      IAB      Ectopic      Multiple      Live Births               Home Medications    Prior to Admission medications   Medication Sig Start Date End Date Taking? Authorizing Provider  fluconazole (DIFLUCAN) 150 MG tablet Take 1 tablet today. If  still with symptoms may repeat in 3 days. 10/22/20  Yes Zigmund Gottron, NP    Family History Family History  Problem Relation Age of Onset  . Colon cancer Mother 59  . Stomach cancer Maternal Grandfather        dx in his 62s-60s  . Breast cancer Maternal Aunt 30       possible BRCA testing  . Breast cancer Maternal Aunt 42  . Diabetes Neg Hx   . Heart disease Neg Hx   . Hypertension Neg Hx   . Rectal cancer Neg Hx     Social History Social History   Tobacco Use  . Smoking status: Former Smoker    Packs/day: 0.50    Years: 5.00    Pack years: 2.50    Types: Cigars    Quit date: 08/04/2008    Years since quitting: 12.2  . Smokeless tobacco: Never Used  . Tobacco comment: cigars - 4 cigars/pk  Vaping Use  . Vaping Use: Never used  Substance Use Topics  . Alcohol use: Yes  Alcohol/week: 8.0 standard drinks    Types: 8 Glasses of wine per week  . Drug use: No     Allergies   Penicillins   Review of Systems Review of Systems   Physical Exam Triage Vital Signs ED Triage Vitals  Enc Vitals Group     BP 10/22/20 0830 126/77     Pulse Rate 10/22/20 0830 87     Resp 10/22/20 0830 16     Temp 10/22/20 0830 98.2 F (36.8 C)     Temp Source 10/22/20 0830 Oral     SpO2 10/22/20 0830 96 %     Weight --      Height --      Head Circumference --      Peak Flow --      Pain Score 10/22/20 0829 5     Pain Loc --      Pain Edu? --      Excl. in [place]? --    No data found.  Updated Vital Signs BP 126/77 (BP Location: Left Arm)   Pulse 87   Temp 98.2 F (36.8 C) (Oral)   Resp 16   LMP  (Within Months) Comment: 1 month   SpO2 96%    Physical Exam Constitutional:      General: She is not in acute distress.    Appearance: She is well-developed.  Cardiovascular:     Rate and Rhythm: Normal rate.  Pulmonary:     Effort: Pulmonary effort is normal.  Abdominal:     Palpations: Abdomen is not rigid.     Tenderness: There is no abdominal tenderness. There is  no guarding or rebound.  Genitourinary:    Comments: Denies sores, lesions, vaginal bleeding; no pelvic pain; gu exam deferred at this time, vaginal self swab collected.   Skin:    General: Skin is warm and dry.  Neurological:     Mental Status: She is alert and oriented to person, place, and time.      UC Treatments / Results  Labs (all labs ordered are listed, but only abnormal results are displayed) Labs Reviewed  POCT URINALYSIS DIPSTICK, ED / UC - Abnormal; Notable for the following components:      Result Value   Hgb urine dipstick SMALL (*)    Protein, ur 30 (*)    Leukocytes,Ua LARGE (*)    All other components within normal limits  URINE CULTURE  POC URINE PREG, ED  CERVICOVAGINAL ANCILLARY ONLY    EKG   Radiology No results found.  Procedures Procedures (including critical care time)  Medications Ordered in UC Medications - No data to display  Initial Impression / Assessment and Plan / UC Course  I have reviewed the triage vital signs and the nursing notes.  Pertinent labs & imaging results that were available during my care of the patient were reviewed by me and considered in my medical decision making (see chart for details).     Diflucan provided today pending vaginal cytology. Return precautions provided. Patient verbalized understanding and agreeable to plan.     Final Clinical Impressions(s) / UC Diagnoses   Final diagnoses:  Vaginitis and vulvovaginitis     Discharge Instructions     I do not have diflucan here today unfortunately, but have sent it to the pharmacy for you. Take 1 tab today and then may repeat in 3 days We will notify of you any positive findings or if any changes to treatment are needed. If normal  or otherwise without concern to your results, we will not call you. Please log on to your MyChart to review your results if interested in so.     ED Prescriptions    Medication Sig Dispense Auth. Provider   fluconazole  (DIFLUCAN) 150 MG tablet Take 1 tablet today. If still with symptoms may repeat in 3 days. 2 tablet Zigmund Gottron, NP     PDMP not reviewed this encounter.   Zigmund Gottron, NP 10/22/20 [phone]

## 2020-10-22 NOTE — Discharge Instructions (Signed)
I do not have diflucan here today unfortunately, but have sent it to the pharmacy for you. Take 1 tab today and then may repeat in 3 days We will notify of you any positive findings or if any changes to treatment are needed. If normal or otherwise without concern to your results, we will not call you. Please log on to your MyChart to review your results if interested in so.

## 2020-10-23 LAB — CERVICOVAGINAL ANCILLARY ONLY
Bacterial Vaginitis (gardnerella): NEGATIVE
Candida Glabrata: NEGATIVE
Candida Vaginitis: POSITIVE — AB
Chlamydia: NEGATIVE
Comment: NEGATIVE
Comment: NEGATIVE
Comment: NEGATIVE
Comment: NEGATIVE
Comment: NEGATIVE
Comment: NORMAL
Neisseria Gonorrhea: NEGATIVE
Trichomonas: NEGATIVE

## 2020-10-23 LAB — URINE CULTURE

## 2021-05-27 ENCOUNTER — Encounter: Payer: Self-pay | Admitting: Gastroenterology

## 2021-06-13 ENCOUNTER — Ambulatory Visit (INDEPENDENT_AMBULATORY_CARE_PROVIDER_SITE_OTHER): Payer: Medicaid Other

## 2021-06-13 ENCOUNTER — Encounter (HOSPITAL_COMMUNITY): Payer: Self-pay | Admitting: Emergency Medicine

## 2021-06-13 ENCOUNTER — Other Ambulatory Visit: Payer: Self-pay

## 2021-06-13 ENCOUNTER — Ambulatory Visit (HOSPITAL_COMMUNITY)
Admission: EM | Admit: 2021-06-13 | Discharge: 2021-06-13 | Disposition: A | Discharge status: Home / Self Care | Payer: Medicaid Other | Attending: Physician Assistant | Admitting: Physician Assistant

## 2021-06-13 DIAGNOSIS — J40 Bronchitis, not specified as acute or chronic: Secondary | ICD-10-CM

## 2021-06-13 DIAGNOSIS — R059 Cough, unspecified: Secondary | ICD-10-CM | POA: Diagnosis not present

## 2021-06-13 DIAGNOSIS — R051 Acute cough: Secondary | ICD-10-CM

## 2021-06-13 MED ORDER — METHYLPREDNISOLONE SODIUM SUCC 125 MG IJ SOLR
80.0000 mg | Freq: Once | INTRAMUSCULAR | Status: AC
  Administered 2021-06-13: 80 mg via INTRAMUSCULAR

## 2021-06-13 MED ORDER — ALBUTEROL SULFATE HFA 108 (90 BASE) MCG/ACT IN AERS
2.0000 | INHALATION_SPRAY | Freq: Once | RESPIRATORY_TRACT | Status: AC
  Administered 2021-06-13: 2 via RESPIRATORY_TRACT

## 2021-06-13 MED ORDER — ALBUTEROL SULFATE HFA 108 (90 BASE) MCG/ACT IN AERS
INHALATION_SPRAY | RESPIRATORY_TRACT | Status: AC
  Filled 2021-06-13: qty 6.7

## 2021-06-13 MED ORDER — PREDNISONE 10 MG (21) PO TBPK: ORAL_TABLET | ORAL | 0 refills | Status: DC

## 2021-06-13 MED ORDER — PROMETHAZINE-DM 6.25-15 MG/5ML PO SYRP: 5.0000 mL | ORAL_SOLUTION | Freq: Three times a day (TID) | ORAL | 0 refills | Status: DC | PRN

## 2021-06-13 MED ORDER — METHYLPREDNISOLONE SODIUM SUCC 125 MG IJ SOLR
INTRAMUSCULAR | Status: AC
  Filled 2021-06-13: qty 2

## 2021-06-13 NOTE — ED Triage Notes (Signed)
Pt is present today with a cough that started one week ago.

## 2021-06-13 NOTE — ED Provider Notes (Signed)
[facility]    CSN: [id number] Arrival date & time: 06/13/21  0802      History   Chief Complaint Chief Complaint  Patient presents with   Cough    HPI Suzanne Campbell is a 35 y.o. female.   Patient presents today with a several week history of cough.  Reports symptoms began as typical URI that she caught from her kids and she took TheraFlu.  The symptoms including fever, nasal congestion, body aches, headache resolved she continues to have cough.  Cough is dry and tends to be triggered by deep breathing.  She has been able to sleep for the past several days as a result of cough.  Denies history of asthma, COPD, smoking.  Denies any recent antibiotics.  She denies any recent steroid use and denies history of diabetes.  She is confident she is not pregnant.  Does not smoke; quit several years ago.  She has not had COVID but has had vaccine.   Past Medical History:  Diagnosis Date   Family history of breast cancer    mother's sister (39)   Family history of colon cancer    mother   Family history of stomach cancer    MGF    Patient Active Problem List   Diagnosis Date Noted   Dysmenorrhea 12/25/2016   Genetic testing 12/28/2015   Family history of colon cancer    Family history of breast cancer    Family history of stomach cancer    Overweight (BMI 25.0-29.9) 03/24/2014   Family history of cancer 03/24/2014   Family history of colon cancer in mother 03/24/2014   Excessive cerumen in both ear canals 03/24/2014    Past Surgical History:  Procedure Laterality Date   CESAREAN SECTION  2007   x 4 children    TUBAL LIGATION  2013    OB History     Gravida  4   Para  4   Term  4   Preterm      AB      Living  4      SAB      IAB      Ectopic      Multiple      Live Births               Home Medications    Prior to Admission medications   Medication Sig Start Date End Date Taking? Authorizing Provider  predniSONE (STERAPRED  UNI-PAK 21 TAB) 10 MG (21) TBPK tablet As directed 06/13/21  Yes Lanah Steines K, PA-C  promethazine-dextromethorphan (PROMETHAZINE-DM) 6.25-15 MG/5ML syrup Take 5 mLs by mouth 3 (three) times daily as needed for cough. 06/13/21  Yes Adelyne Marchese, Derry Skill, PA-C    Family History Family History  Problem Relation Age of Onset   Colon cancer Mother 29   Stomach cancer Maternal Grandfather        dx in his 63s-60s   Breast cancer Maternal Aunt 30       possible BRCA testing   Breast cancer Maternal Aunt 42   Diabetes Neg Hx    Heart disease Neg Hx    Hypertension Neg Hx    Rectal cancer Neg Hx     Social History Social History   Tobacco Use   Smoking status: Former    Packs/day: 0.50    Years: 5.00    Pack years: 2.50    Types: Cigars, Cigarettes    Quit date: 08/04/2008  Years since quitting: 12.8   Smokeless tobacco: Never   Tobacco comments:    cigars - 4 cigars/pk  Vaping Use   Vaping Use: Never used  Substance Use Topics   Alcohol use: Yes    Alcohol/week: 8.0 standard drinks    Types: 8 Glasses of wine per week   Drug use: No     Allergies   Penicillins   Review of Systems Review of Systems  Constitutional:  Positive for activity change. Negative for appetite change, fatigue and fever.  HENT:  Negative for congestion, sinus pressure, sneezing and sore throat.   Respiratory:  Positive for cough, chest tightness and shortness of breath.   Cardiovascular:  Negative for chest pain.  Gastrointestinal:  Negative for abdominal pain, diarrhea, nausea and vomiting.  Musculoskeletal:  Negative for arthralgias and myalgias.  Neurological:  Negative for dizziness, light-headedness and headaches.    Physical Exam Triage Vital Signs ED Triage Vitals  Enc Vitals Group     BP 06/13/21 0822 115/74     Pulse Rate 06/13/21 0822 84     Resp 06/13/21 0822 18     Temp 06/13/21 0822 98.4 F (36.9 C)     Temp src --      SpO2 06/13/21 0822 99 %     Weight --      Height --       Head Circumference --      Peak Flow --      Pain Score 06/13/21 0821 0     Pain Loc --      Pain Edu? --      Excl. in [place]? --    No data found.  Updated Vital Signs BP 115/74   Pulse 84   Temp 98.4 F (36.9 C)   Resp 18   SpO2 99%   Visual Acuity Right Eye Distance:   Left Eye Distance:   Bilateral Distance:    Right Eye Near:   Left Eye Near:    Bilateral Near:     Physical Exam Vitals reviewed.  Constitutional:      General: She is awake. She is not in acute distress.    Appearance: Normal appearance. She is well-developed. She is not ill-appearing.     Comments: Very pleasant female appears stated age in no acute distress sitting comfortably in exam room  HENT:     Head: Normocephalic and atraumatic.     Right Ear: Tympanic membrane, ear canal and external ear normal. Tympanic membrane is not erythematous or bulging.     Left Ear: Tympanic membrane, ear canal and external ear normal. Tympanic membrane is not erythematous or bulging.     Nose:     Right Sinus: No maxillary sinus tenderness or frontal sinus tenderness.     Left Sinus: No maxillary sinus tenderness or frontal sinus tenderness.     Mouth/Throat:     Pharynx: Uvula midline. Posterior oropharyngeal erythema present. No oropharyngeal exudate.  Cardiovascular:     Rate and Rhythm: Normal rate and regular rhythm.     Heart sounds: Normal heart sounds, S1 normal and S2 normal. No murmur heard. Pulmonary:     Effort: Pulmonary effort is normal.     Breath sounds: Normal breath sounds. No wheezing, rhonchi or rales.     Comments: Reactive cough with deep breathing Psychiatric:        Behavior: Behavior is cooperative.     UC Treatments / Results  Labs (all labs ordered are listed,  but only abnormal results are displayed) Labs Reviewed - No data to display  EKG   Radiology DG Chest 2 View  Result Date: 06/13/2021 CLINICAL DATA:  Cough over the last week.  Smoking history. EXAM: CHEST - 2 VIEW  COMPARISON:  None. FINDINGS: Heart size is normal. Mediastinal shadows are normal. There is mild central bronchial thickening but no infiltrate, collapse or effusion. Bony structures are normal. IMPRESSION: Mild bronchitis pattern.  No consolidation or collapse. Electronically Signed   By: Nelson Chimes M.D.   On: 06/13/2021 08:45    Procedures Procedures (including critical care time)  Medications Ordered in UC Medications  albuterol (VENTOLIN HFA) 108 (90 Base) MCG/ACT inhaler 2 puff (2 puffs Inhalation Given 06/13/21 0854)  methylPREDNISolone sodium succinate (SOLU-MEDROL) 125 mg/2 mL injection 80 mg (80 mg Intramuscular Given 06/13/21 0853)    Initial Impression / Assessment and Plan / UC Course  I have reviewed the triage vital signs and the nursing notes.  Pertinent labs & imaging results that were available during my care of the patient were reviewed by me and considered in my medical decision making (see chart for details).     X-ray obtained given several weeks of cough showed likely bronchitis changes.  No evidence of acute infection on physical exam that would warrant initiation of antibiotics.  Patient was given Solu-Medrol and albuterol in clinic with improvement but not resolution of symptoms.  We will start prednisone taper outpatient and she was instructed not to take NSAIDs with this medication due to risk of GI bleeding.  She can use albuterol every 4-6 hours as needed for cough and shortness of breath.  She was prescribed meclizine DM with instruction not to drive drink alcohol while taking this medication as drowsiness is a common side effect.  She can use Mucinex and Flonase for additional symptom relief.  Discussed alarm symptoms that warrant emergent evaluation.  Strict return precautions given to which she expressed understanding.  Final Clinical Impressions(s) / UC Diagnoses   Final diagnoses:  Bronchitis  Acute cough     Discharge Instructions      I believe  that you have bronchitis.  Please start prednisone tomorrow as discussed.  Do not take NSAIDs including aspirin, ibuprofen/Advil, naproxen/Aleve with this medication because it can cause stomach bleeding.  Take Tylenol if needed for pain.  Use Promethazine DM for cough.  This can make you sleepy so do not drive or drink alcohol while taking it.  Use Mucinex and Flonase for additional symptom relief.  Make sure you are drinking plenty of fluid.  If you have any worsening symptoms including high fever, worsening cough, shortness of breath, chest discomfort you need to go to the emergency room.     ED Prescriptions     Medication Sig Dispense Auth. Provider   predniSONE (STERAPRED UNI-PAK 21 TAB) 10 MG (21) TBPK tablet As directed 21 tablet Dalon Reichart K, PA-C   promethazine-dextromethorphan (PROMETHAZINE-DM) 6.25-15 MG/5ML syrup Take 5 mLs by mouth 3 (three) times daily as needed for cough. 118 mL Norina Cowper K, PA-C      PDMP not reviewed this encounter.   Terrilee Croak, PA-C 06/13/21 [id number]

## 2021-06-13 NOTE — Discharge Instructions (Signed)
I believe that you have bronchitis.  Please start prednisone tomorrow as discussed.  Do not take NSAIDs including aspirin, ibuprofen/Advil, naproxen/Aleve with this medication because it can cause stomach bleeding.  Take Tylenol if needed for pain.  Use Promethazine DM for cough.  This can make you sleepy so do not drive or drink alcohol while taking it.  Use Mucinex and Flonase for additional symptom relief.  Make sure you are drinking plenty of fluid.  If you have any worsening symptoms including high fever, worsening cough, shortness of breath, chest discomfort you need to go to the emergency room.

## 2021-06-25 ENCOUNTER — Ambulatory Visit (HOSPITAL_COMMUNITY)
Admission: EM | Admit: 2021-06-25 | Discharge: 2021-06-25 | Disposition: A | Discharge status: Home / Self Care | Payer: Medicaid Other | Attending: Emergency Medicine | Admitting: Emergency Medicine

## 2021-06-25 ENCOUNTER — Telehealth (HOSPITAL_COMMUNITY): Payer: Self-pay | Admitting: Emergency Medicine

## 2021-06-25 ENCOUNTER — Encounter (HOSPITAL_COMMUNITY): Payer: Self-pay | Admitting: Emergency Medicine

## 2021-06-25 ENCOUNTER — Other Ambulatory Visit: Payer: Self-pay

## 2021-06-25 DIAGNOSIS — G44209 Tension-type headache, unspecified, not intractable: Secondary | ICD-10-CM | POA: Diagnosis not present

## 2021-06-25 DIAGNOSIS — J101 Influenza due to other identified influenza virus with other respiratory manifestations: Secondary | ICD-10-CM | POA: Diagnosis not present

## 2021-06-25 DIAGNOSIS — R051 Acute cough: Secondary | ICD-10-CM

## 2021-06-25 DIAGNOSIS — J0100 Acute maxillary sinusitis, unspecified: Secondary | ICD-10-CM | POA: Diagnosis not present

## 2021-06-25 DIAGNOSIS — R0981 Nasal congestion: Secondary | ICD-10-CM

## 2021-06-25 LAB — POC INFLUENZA A AND B ANTIGEN (URGENT CARE ONLY)
INFLUENZA A ANTIGEN, POC: POSITIVE — AB
INFLUENZA B ANTIGEN, POC: NEGATIVE

## 2021-06-25 MED ORDER — IPRATROPIUM BROMIDE 0.06 % NA SOLN: 2.0000 | Freq: Four times a day (QID) | NASAL | 12 refills | Status: AC

## 2021-06-25 MED ORDER — PSEUDOEPHEDRINE HCL 30 MG PO TABS: 60.0000 mg | ORAL_TABLET | Freq: Three times a day (TID) | ORAL | 0 refills | Status: AC | PRN

## 2021-06-25 MED ORDER — PROMETHAZINE-DM 6.25-15 MG/5ML PO SYRP: 5.0000 mL | ORAL_SOLUTION | Freq: Three times a day (TID) | ORAL | 0 refills | Status: AC | PRN

## 2021-06-25 MED ORDER — FLUTICASONE PROPIONATE 50 MCG/ACT NA SUSP: 2.0000 | Freq: Every day | NASAL | 0 refills | Status: AC

## 2021-06-25 NOTE — Discharge Instructions (Addendum)
Your influenza test today is positive for influenza A.  Unfortunately, you are outside the window of treatment with the antiviral medications.  I have provided you with 2 nasal sprays that should give you significant improvement of your upper respiratory congestion and sinus drainage.  Please use the Flonase once daily and Atrovent can be used up to 4 times daily in addition to the Flonase as needed.  These two nasal sprays will significantly reduce your sinus drainage which should in turn significantly reduce your cough.    Please do keep using your albuterol inhaler as this is certainly providing you with great benefit.  I also sent a prescription for Sudafed that she can use for the next few days that will also help dry up mucous membranes and make you feel a whole lot better.  Please see below for further over-the-counter medications that you can certainly try.  Conservative care is recommended at this time.  This includes rest, pushing clear fluids and activity as tolerated.  You may also noticed that your appetite is reduced, this is okay as long as they are drinking plenty of clear fluids.  Acetaminophen (Tylenol): This is a good fever reducer.  If there body temperature rises above 101.5 as measured with a thermometer, it is recommended that you give them 1,000 mg every 6-8 hours until they are temperature falls below 101.5, please not take more than 3,000 mg of acetaminophen either as a separate medication or as in ingredient in an over-the-counter cold/flu preparation within a 24-hour period  Ibuprofen  (Advil, Motrin): This is a good anti-inflammatory medication which addresses aches and pains and, to some degree, congestion in the nasal passages.  I recommend giving between 400 to 600 mg every 6-8 hours as needed.  Pseudoephedrine (Sudafed): This is a decongestant.  This medication has to be purchased from the pharmacist counter, I recommend giving 2 tablets, 60 mg, 2-3 times a day as needed to  relieve runny nose and sinus drainage.  Guaifenesin (Robitussin, Mucinex): This is an expectorant.  This helps break up chest congestion and loosen up thick nasal drainage making phlegm and drainage more liquid and therefore easier to remove.  I recommend being 400 mg three times daily as needed.  Dextromethorphan (any cough medicine with the letters "DM" added to it's name such as Robitussin DM): This is a cough suppressant.  This is often recommended to be taken at nighttime to suppress cough and help children sleep.  Give dosage as directed on the bottle.   Chloraseptic Throat Spray: Spray 5 sprays into affected area every 2 hours, hold for 15 seconds and either swallow or spit it out.  This is a excellent numbing medication because it is a spray, you can put it right where you needed and so sucking on a lozenge and numbing your entire mouth.  Please follow-up within the next 3 to 5 days either with your primary care provider or urgent care if their symptoms do not resolve.  If you do not have a primary care provider, we will assist you in finding one.

## 2021-06-25 NOTE — Telephone Encounter (Signed)
Tried contacting pt to inform. Unable to LVM

## 2021-06-25 NOTE — ED Triage Notes (Signed)
Pt is present today with cough, wheezing, and vomiting. Pt states that her sx started x2 weeks ago.

## 2021-06-25 NOTE — Telephone Encounter (Signed)
Tried returning pts call, no answer, unable to LVM.

## 2021-06-25 NOTE — Telephone Encounter (Signed)
Patient requests refill of cough medicine.

## 2021-06-25 NOTE — ED Provider Notes (Signed)
[place]    CSN: [id number] Arrival date & time: 06/25/21  0848    UC Diagnoses / Final Clinical Impressions(s)    Final diagnoses:  Acute cough  Acute non intractable tension-type headache  Nasal congestion  Acute maxillary sinusitis, recurrence not specified  Influenza A   I have reviewed the triage vital signs and the nursing notes.  Pertinent labs & imaging results that were available during my care of the patient were reviewed by me and considered in my medical decision making (see chart for details).    Patient tested positive for influenza A, unfortunate she is out of the window for treatment with Tamiflu.  I provided patient with some decongestants, Sudafed, Flonase and Atrovent nasal spray.  Return precautions advised.  ED Prescriptions     Medication Sig Dispense Auth. Provider   fluticasone (FLONASE) 50 MCG/ACT nasal spray Place 2 sprays into both nostrils daily. 18 mL Lynden Oxford Scales, PA-C   ipratropium (ATROVENT) 0.06 % nasal spray Place 2 sprays into both nostrils 4 (four) times daily. As needed for nasal congestion, runny nose 15 mL Lynden Oxford Scales, PA-C   pseudoephedrine (SUDAFED) 30 MG tablet Take 2 tablets (60 mg total) by mouth every 8 (eight) hours as needed for up to 3 days for congestion. 18 tablet Lynden Oxford Scales, PA-C      PDMP not reviewed this encounter.  Pending results:  Labs Reviewed  POC INFLUENZA A AND B ANTIGEN (URGENT CARE ONLY) - Abnormal; Notable for the following components:      Result Value   INFLUENZA A ANTIGEN, POC POSITIVE (*)    All other components within normal limits   Medications Ordered in UC: Medications - No data to display  Discharge Instructions:   Discharge Instructions      Your influenza test today is positive for influenza A.  Unfortunately, you are outside the window of treatment with the antiviral medications.  I have provided you with 2 nasal sprays that should give you  significant improvement of your upper respiratory congestion and sinus drainage.  Please use the Flonase once daily and Atrovent can be used up to 4 times daily in addition to the Flonase as needed.  These two nasal sprays will significantly reduce your sinus drainage which should in turn significantly reduce your cough.    Please do keep using your albuterol inhaler as this is certainly providing you with great benefit.  I also sent a prescription for Sudafed that she can use for the next few days that will also help dry up mucous membranes and make you feel a whole lot better.  Please see below for further over-the-counter medications that you can certainly try.  Conservative care is recommended at this time.  This includes rest, pushing clear fluids and activity as tolerated.  You may also noticed that your appetite is reduced, this is okay as long as they are drinking plenty of clear fluids.  Acetaminophen (Tylenol): This is a good fever reducer.  If there body temperature rises above 101.5 as measured with a thermometer, it is recommended that you give them 1,000 mg every 6-8 hours until they are temperature falls below 101.5, please not take more than 3,000 mg of acetaminophen either as a separate medication or as in ingredient in an over-the-counter cold/flu preparation within a 24-hour period  Ibuprofen  (Advil, Motrin): This is a good anti-inflammatory medication which addresses aches and pains and, to some degree, congestion in the nasal passages.  I recommend giving between 400 to 600 mg every 6-8 hours as needed.  Pseudoephedrine (Sudafed): This is a decongestant.  This medication has to be purchased from the pharmacist counter, I recommend giving 2 tablets, 60 mg, 2-3 times a day as needed to relieve runny nose and sinus drainage.  Guaifenesin (Robitussin, Mucinex): This is an expectorant.  This helps break up chest congestion and loosen up thick nasal drainage making phlegm and drainage  more liquid and therefore easier to remove.  I recommend being 400 mg three times daily as needed.  Dextromethorphan (any cough medicine with the letters "DM" added to it's name such as Robitussin DM): This is a cough suppressant.  This is often recommended to be taken at nighttime to suppress cough and help children sleep.  Give dosage as directed on the bottle.   Chloraseptic Throat Spray: Spray 5 sprays into affected area every 2 hours, hold for 15 seconds and either swallow or spit it out.  This is a excellent numbing medication because it is a spray, you can put it right where you needed and so sucking on a lozenge and numbing your entire mouth.  Please follow-up within the next 3 to 5 days either with your primary care provider or urgent care if their symptoms do not resolve.  If you do not have a primary care provider, we will assist you in finding one.        History   Chief Complaint Chief Complaint  Patient presents with   Cough   HPI Suzanne Campbell is a 35 y.o. female. Patient presents today with a 2-week history of nasal congestion, sinus pressure, nonproductive cough, vomiting of phlegm, headache and audible wheezing.  Patient was seen on June 13, 2021 with similar symptoms, at the time she was prescribed prednisone Dosepak, Promethazine DM and an albuterol inhaler.  Patient states that all of these medications helped initially but adds that she felt that the steroid treatment was not strong enough and not long enough.  Patient states has been using albuterol inhaler since her symptoms worsened.  Patient states this morning she felt very short of breath so she decided come in to be evaluated.  Patient has elevated temperature today and she is tachycardic.  Patient is ill on appearing per observation.  Patient denies a significant history of upper respiratory issues such as asthma, allergies, bronchitis or pneumonia while growing up but adds that she had pneumonia when she was  born.    Past Medical History:  Diagnosis Date   Family history of breast cancer    mother's sister (57)   Family history of colon cancer    mother   Family history of stomach cancer    MGF   Patient Active Problem List   Diagnosis Date Noted   Dysmenorrhea 12/25/2016   Genetic testing 12/28/2015   Family history of colon cancer    Family history of breast cancer    Family history of stomach cancer    Overweight (BMI 25.0-29.9) 03/24/2014   Family history of cancer 03/24/2014   Family history of colon cancer in mother 03/24/2014   Excessive cerumen in both ear canals 03/24/2014   Past Surgical History:  Procedure Laterality Date   CESAREAN SECTION  2007   x 4 children    TUBAL LIGATION  2013   OB History     Gravida  4   Para  4   Term  4   Preterm  AB      Living  4      SAB      IAB      Ectopic      Multiple      Live Births             Home Medications    Prior to Admission medications   Medication Sig Start Date End Date Taking? Authorizing Provider  fluticasone (FLONASE) 50 MCG/ACT nasal spray Place 2 sprays into both nostrils daily. 06/25/21  Yes Lynden Oxford Scales, PA-C  ipratropium (ATROVENT) 0.06 % nasal spray Place 2 sprays into both nostrils 4 (four) times daily. As needed for nasal congestion, runny nose 06/25/21  Yes Lynden Oxford Scales, PA-C  pseudoephedrine (SUDAFED) 30 MG tablet Take 2 tablets (60 mg total) by mouth every 8 (eight) hours as needed for up to 3 days for congestion. 06/25/21 06/28/21 Yes Lynden Oxford Scales, PA-C  promethazine-dextromethorphan (PROMETHAZINE-DM) 6.25-15 MG/5ML syrup Take 5 mLs by mouth 3 (three) times daily as needed for cough. 06/13/21   Raspet, Derry Skill, PA-C   Family History Family History  Problem Relation Age of Onset   Colon cancer Mother 72   Stomach cancer Maternal Grandfather        dx in his 70s-60s   Breast cancer Maternal Aunt 30       possible BRCA testing   Breast  cancer Maternal Aunt 42   Diabetes Neg Hx    Heart disease Neg Hx    Hypertension Neg Hx    Rectal cancer Neg Hx    Social History Social History   Tobacco Use   Smoking status: Former    Packs/day: 0.50    Years: 5.00    Pack years: 2.50    Types: Cigars, Cigarettes    Quit date: 08/04/2008    Years since quitting: 12.8   Smokeless tobacco: Never   Tobacco comments:    cigars - 4 cigars/pk  Vaping Use   Vaping Use: Never used  Substance Use Topics   Alcohol use: Yes    Alcohol/week: 8.0 standard drinks    Types: 8 Glasses of wine per week   Drug use: No   Allergies   Penicillins  Review of Systems Review of Systems Pertinent findings noted in history of present illness.   Physical Exam Triage Vital Signs ED Triage Vitals  Enc Vitals Group     BP 05/31/21 0827 (!) 147/82     Pulse Rate 05/31/21 0827 72     Resp 05/31/21 0827 18     Temp 05/31/21 0827 98.3 F (36.8 C)     Temp Source 05/31/21 0827 Oral     SpO2 05/31/21 0827 98 %     Weight --      Height --      Head Circumference --      Peak Flow --      Pain Score 05/31/21 0826 5     Pain Loc --      Pain Edu? --      Excl. in [place]? --   No data found.  Updated Vital Signs BP 124/62   Pulse (!) 109   Temp (!) 100.4 F (38 C) (Oral)   Resp 19   SpO2 98%   Physical Exam Vitals and nursing note reviewed.  Constitutional:      General: She is not in acute distress.    Appearance: Normal appearance. She is not ill-appearing.  HENT:  Head: Normocephalic and atraumatic.     Salivary Glands: Right salivary gland is not diffusely enlarged or tender. Left salivary gland is not diffusely enlarged or tender.     Right Ear: Tympanic membrane, ear canal and external ear normal. No drainage. No middle ear effusion. There is no impacted cerumen. Tympanic membrane is not erythematous or bulging.     Left Ear: Tympanic membrane, ear canal and external ear normal. No drainage.  No middle ear effusion. There is  no impacted cerumen. Tympanic membrane is not erythematous or bulging.     Nose: Rhinorrhea present. No nasal deformity, septal deviation, mucosal edema or congestion. Rhinorrhea is clear.     Right Turbinates: Enlarged, swollen and pale.     Left Turbinates: Enlarged, swollen and pale.     Right Sinus: Maxillary sinus tenderness present. No frontal sinus tenderness.     Left Sinus: Maxillary sinus tenderness present. No frontal sinus tenderness.     Mouth/Throat:     Lips: Pink. No lesions.     Mouth: Mucous membranes are moist. No oral lesions.     Pharynx: Uvula midline. Pharyngeal swelling, posterior oropharyngeal erythema and uvula swelling present. No oropharyngeal exudate.     Tonsils: No tonsillar exudate. 0 on the right. 0 on the left.  Eyes:     General: Lids are normal.        Right eye: No discharge.        Left eye: No discharge.     Extraocular Movements: Extraocular movements intact.     Conjunctiva/sclera: Conjunctivae normal.     Right eye: Right conjunctiva is not injected.     Left eye: Left conjunctiva is not injected.  Neck:     Trachea: Trachea and phonation normal.  Cardiovascular:     Rate and Rhythm: Normal rate and regular rhythm.     Pulses: Normal pulses.     Heart sounds: Normal heart sounds. No murmur heard.   No friction rub. No gallop.  Pulmonary:     Effort: Pulmonary effort is normal. No accessory muscle usage, prolonged expiration or respiratory distress.     Breath sounds: Normal breath sounds. No stridor, decreased air movement or transmitted upper airway sounds. No decreased breath sounds, wheezing, rhonchi or rales.  Chest:     Chest wall: No tenderness.  Musculoskeletal:        General: Normal range of motion.     Cervical back: Normal range of motion and neck supple. Normal range of motion.  Lymphadenopathy:     Cervical: No cervical adenopathy.  Skin:    General: Skin is warm and dry.     Findings: No erythema or rash.  Neurological:      General: No focal deficit present.     Mental Status: She is alert and oriented to person, place, and time.  Psychiatric:        Mood and Affect: Mood normal.        Behavior: Behavior normal.    Visual Acuity Right Eye Distance:   Left Eye Distance:   Bilateral Distance:    Right Eye Near:   Left Eye Near:    Bilateral Near:     UC Couse / Diagnostics / Procedures:    EKG  Radiology No results found.  Procedures Procedures (including critical care time)  Disposition Upon Discharge:  Patient presented with an acute illness with associated systemic symptoms and significant discomfort requiring urgent management. In my opinion, this is a condition that  a prudent lay person (someone who possesses an average knowledge of health and medicine) may potentially expect to result in complications if not addressed urgently such as respiratory distress, impairment of bodily function or dysfunction of bodily organs.   Routine symptom specific, illness specific and/or disease specific instructions were discussed with the patient and/or caregiver at length.   As such, the patient has been evaluated and assessed, work-up was performed and treatment was provided in alignment with urgent care protocols and evidence based medicine.  Patient/parent/caregiver has been advised that the patient may require follow up for further testing and treatment if the symptoms continue in spite of treatment, as clinically indicated and appropriate.  Patient was tested for COVID-19, Influenza and/or RSV, the patient/parent/guardian was advised to isolate at home pending the results of his/her diagnostic coronavirus test and potentially longer if they're positive. Advised pt that if his/her COVID-19 test returns positive, it's recommended to self-isolate for at least 10 days after symptoms first appeared AND until fever-free for 24 hours without fever reducer AND other symptoms have improved or resolved. Discussed  self-isolation recommendations as well as instructions for household member/close contacts as per the [facility] and [place] DHHS, and also gave patient the [facility] packet with this information.  Patient/parent/caregiver has been advised to return to the [facility] or PCP in 3-5 days if no better; to PCP or the Emergency Department if new signs and symptoms develop, or if the current signs or symptoms continue to change or worsen for further workup, evaluation and treatment as clinically indicated and appropriate  The patient will follow up with their current PCP if and as advised. If the patient does not currently have a PCP we will assist them in obtaining one.   Patient/parent/caregiver verbalized understanding and agreement of plan as discussed.  All questions were addressed during visit.  Please see discharge instructions below for further details of plan.  Condition: stable for discharge home Home: take medications as prescribed; routine discharge instructions as discussed; follow up as advised.    Lynden Oxford Scales, PA-C 06/25/21 1042

## 2022-01-03 ENCOUNTER — Encounter: Payer: Self-pay | Admitting: Gastroenterology

## 2022-01-17 ENCOUNTER — Telehealth: Payer: Self-pay | Admitting: *Deleted

## 2022-01-17 NOTE — Telephone Encounter (Signed)
Pt was a no-show for 1330 PV appt. Called pt, she said she was sorry but couldn't make appointment. Stated she would call back at a better time for her and reschedule. Told her we would have to cancel upcoming procedure, she said she would call back later to reschedule both. PV and Colon. Cancelled per pt request.

## 2022-02-14 ENCOUNTER — Encounter: Payer: Medicaid Other | Admitting: Gastroenterology

## 2022-02-24 ENCOUNTER — Encounter: Payer: Self-pay | Admitting: Gastroenterology

## 2022-03-06 ENCOUNTER — Ambulatory Visit (AMBULATORY_SURGERY_CENTER): Payer: Self-pay | Admitting: *Deleted

## 2022-03-06 VITALS — Ht 66.0 in | Wt 199.0 lb

## 2022-03-06 DIAGNOSIS — Z800 Family history of malignant neoplasm of digestive organs: Secondary | ICD-10-CM

## 2022-03-06 MED ORDER — NA SULFATE-K SULFATE-MG SULF 17.5-3.13-1.6 GM/177ML PO SOLN: 2.0000 | Freq: Once | ORAL | 0 refills | Status: AC

## 2022-03-06 NOTE — Progress Notes (Signed)

## 2022-03-07 IMAGING — DX DG CHEST 2V
2 series · 2 of 2 positions shown · non-contrast
Comparison: None.

CLINICAL DATA: Cough over the last week.  Smoking history.

EXAM:
CHEST - 2 VIEW

[chest pa]
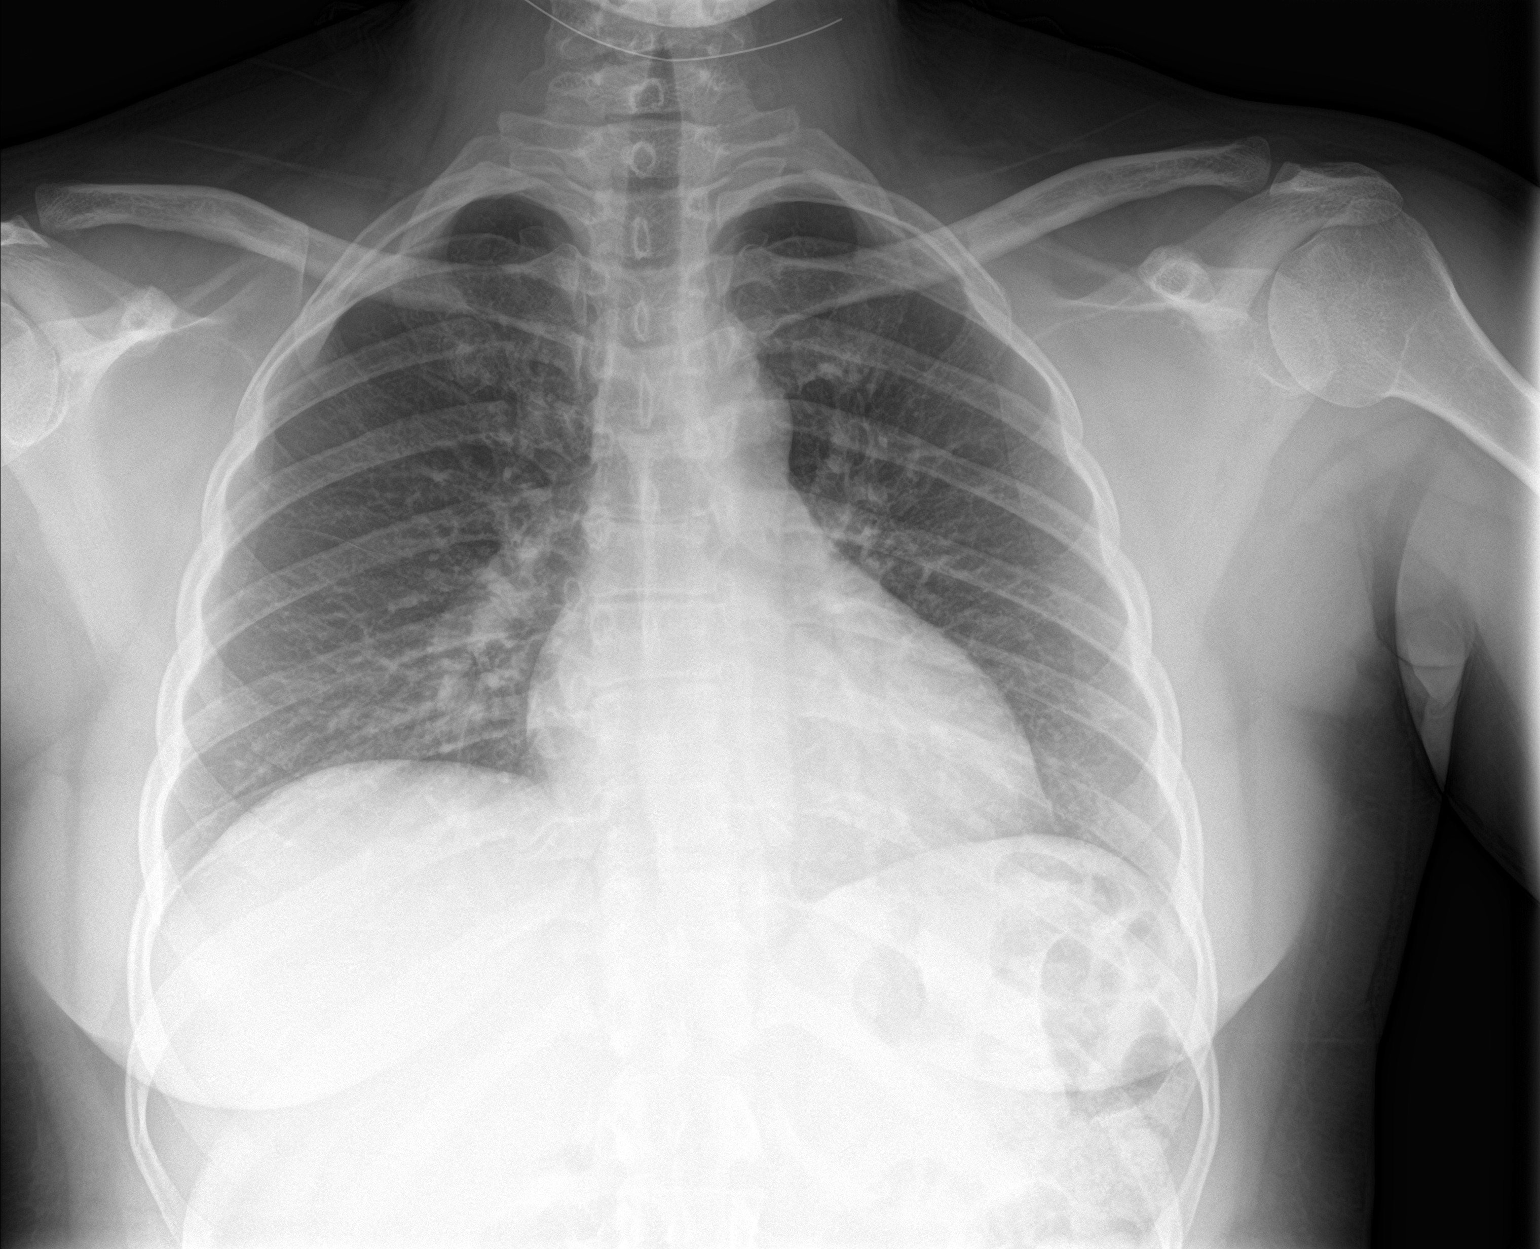

[chest lat]
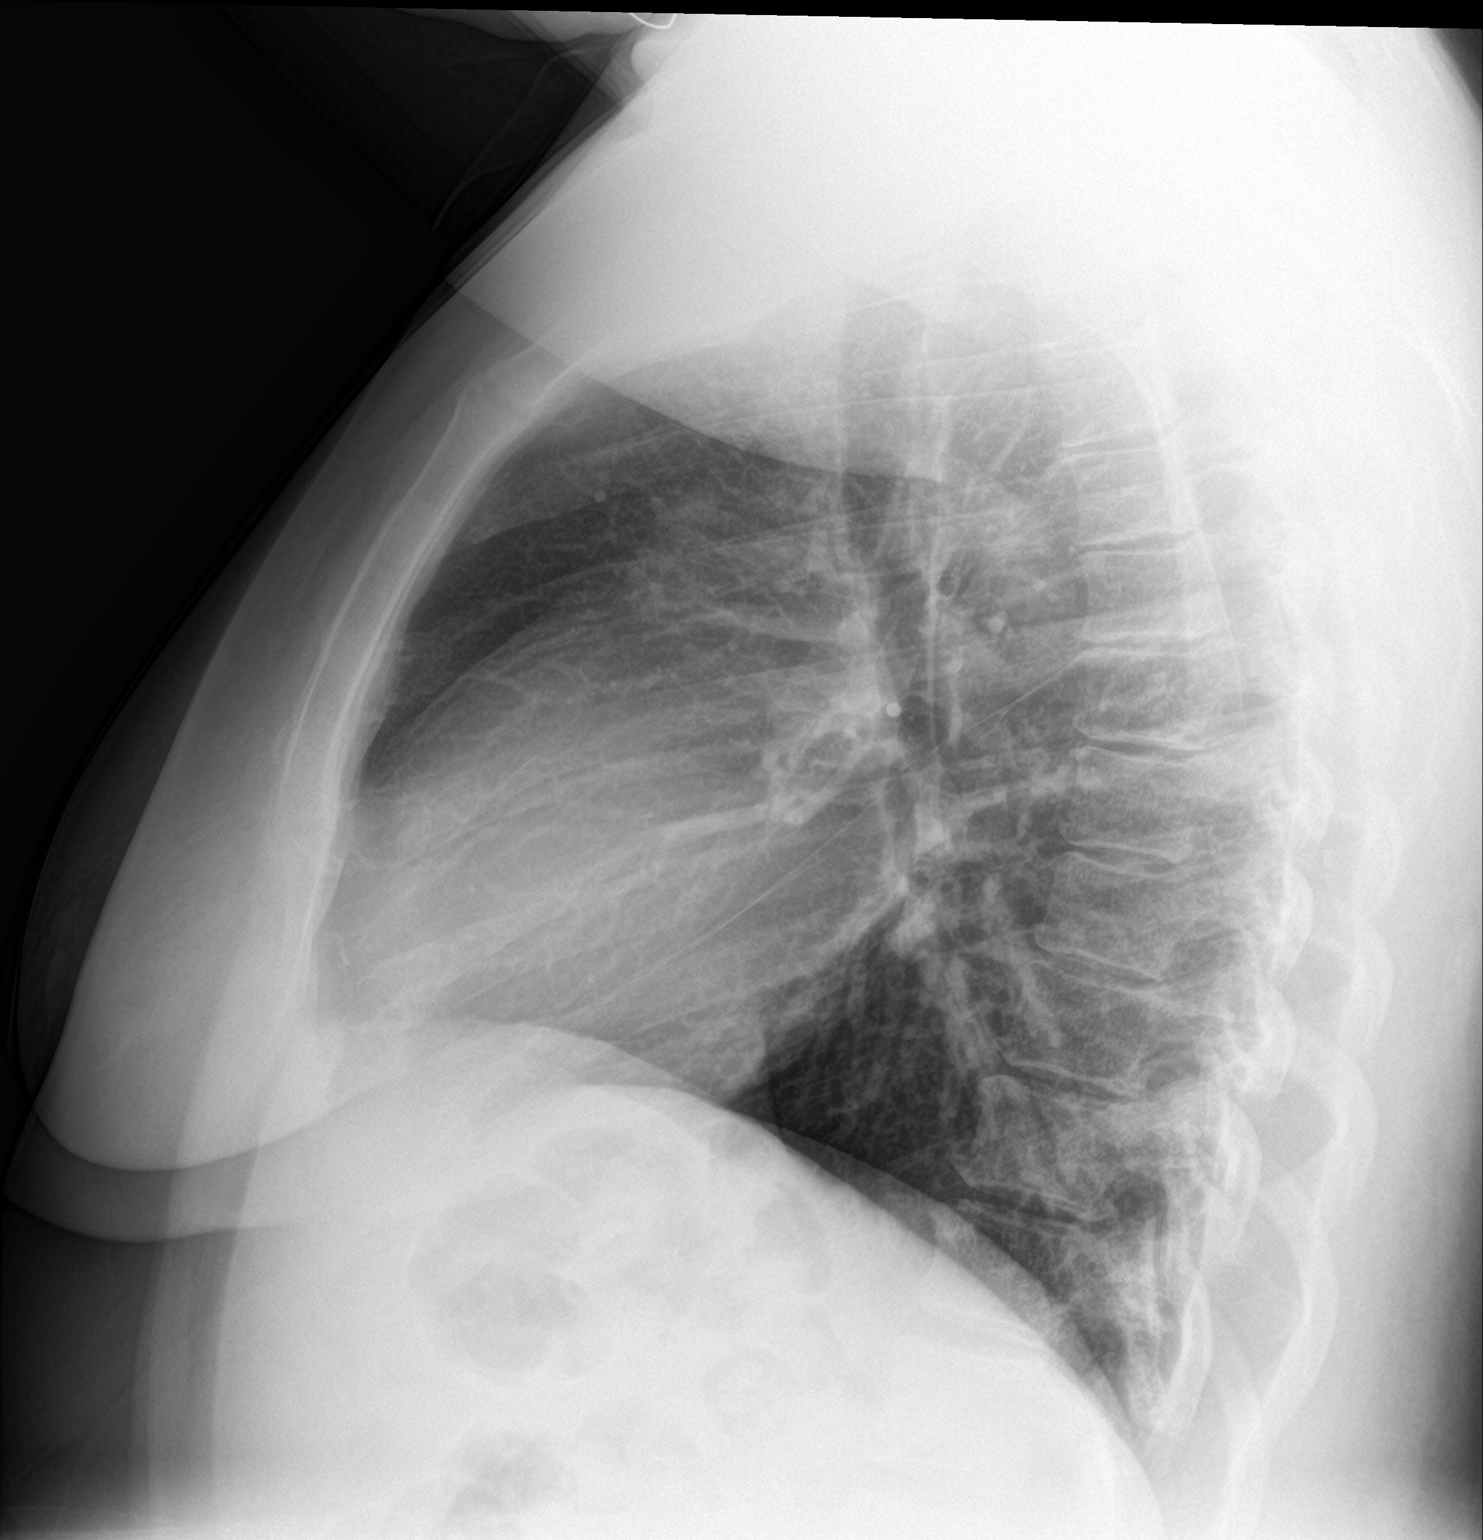

[2 of 2 positions shown; findings below may reference images not displayed]

FINDINGS: Heart size is normal. Mediastinal shadows are normal. There is mild
central bronchial thickening but no infiltrate, collapse or
effusion. Bony structures are normal.
IMPRESSION: Mild bronchitis pattern.  No consolidation or collapse.

## 2022-04-03 ENCOUNTER — Ambulatory Visit (AMBULATORY_SURGERY_CENTER): Payer: Medicaid Other | Admitting: Gastroenterology

## 2022-04-03 ENCOUNTER — Encounter: Payer: Self-pay | Admitting: Gastroenterology

## 2022-04-03 VITALS — BP 121/73 | HR 58 | Temp 96.9°F | Resp 12 | Ht 63.0 in | Wt 199.0 lb

## 2022-04-03 DIAGNOSIS — Z1211 Encounter for screening for malignant neoplasm of colon: Secondary | ICD-10-CM

## 2022-04-03 DIAGNOSIS — Z800 Family history of malignant neoplasm of digestive organs: Secondary | ICD-10-CM | POA: Diagnosis not present

## 2022-04-03 MED ORDER — SODIUM CHLORIDE 0.9 % IV SOLN: 500.0000 mL | Freq: Once | INTRAVENOUS | Status: DC

## 2022-04-03 NOTE — Progress Notes (Signed)
PT taken to PACU. Monitors in place. VSS. Report given to RN. 

## 2022-04-03 NOTE — Patient Instructions (Signed)

## 2022-04-03 NOTE — Progress Notes (Signed)
History and Physical:  This patient presents for endoscopic testing for: Encounter Diagnosis  Name Primary?   Family history of colon cancer Yes    Mother had CRC by age 35 No polyps last colonoscopy 08/2015  Patient is otherwise without complaints or active issues today.   Past Medical History: Past Medical History:  Diagnosis Date   Family history of breast cancer    mother's sister (Aunt)   Family history of colon cancer    mother   Family history of stomach cancer    MGF     Past Surgical History: Past Surgical History:  Procedure Laterality Date   CESAREAN SECTION  2007   x 4 children    TUBAL LIGATION  2013    Allergies: Allergies  Allergen Reactions   Penicillins Hives    Outpatient Meds: Current Outpatient Medications  Medication Sig Dispense Refill   fluticasone (FLONASE) 50 MCG/ACT nasal spray Place 2 sprays into both nostrils daily. (Patient not taking: Reported on 03/06/2022) 18 mL 0   ipratropium (ATROVENT) 0.06 % nasal spray Place 2 sprays into both nostrils 4 (four) times daily. As needed for nasal congestion, runny nose (Patient not taking: Reported on 03/06/2022) 15 mL 12   promethazine-dextromethorphan (PROMETHAZINE-DM) 6.25-15 MG/5ML syrup Take 5 mLs by mouth 3 (three) times daily as needed for cough. (Patient not taking: Reported on 03/06/2022) 118 mL 0   Current Facility-Administered Medications  Medication Dose Route Frequency Provider Last Rate Last Admin   0.9 %  sodium chloride infusion  500 mL Intravenous Once Danis, Starr Lake III, MD          ___________________________________________________________________ Objective   Exam:  BP 103/68   Pulse 78   Temp (!) 96.9 F (36.1 C) (Temporal)   Ht 5\' 3"  (1.6 m)   Wt 199 lb (90.3 kg)   SpO2 98%   BMI 35.25 kg/m   CV: RRR without murmur, S1/S2 Resp: clear to auscultation bilaterally, normal RR and effort noted GI: soft, no tenderness, with active bowel sounds.   Assessment: Encounter  Diagnosis  Name Primary?   Family history of colon cancer Yes     Plan: Colonoscopy  The benefits and risks of the planned procedure were described in detail with the patient or (when appropriate) their health care proxy.  Risks were outlined as including, but not limited to, bleeding, infection, perforation, adverse medication reaction leading to cardiac or pulmonary decompensation, pancreatitis (if ERCP).  The limitation of incomplete mucosal visualization was also discussed.  No guarantees or warranties were given.    The patient is appropriate for an endoscopic procedure in the ambulatory setting.   - , MD

## 2022-04-03 NOTE — Op Note (Signed)
[place] Patient Name: Suzanne Campbell Procedure Date: 04/03/2022 9:16 AM MRN: [id number] Endoscopist: Mallie Mussel L. Loletha Carrow , MD Age: 36 Referring MD:  Date of Birth: 1986-06-19 Gender: Female Account #: [id number] Procedure:                Colonoscopy Indications:              Screening in patient at increased risk: Colorectal                            cancer in mother before age 30 (late 25s) Medicines:                Monitored Anesthesia Care Procedure:                Pre-Anesthesia Assessment:                           - Prior to the procedure, a History and Physical                            was performed, and patient medications and                            allergies were reviewed. The patient's tolerance of                            previous anesthesia was also reviewed. The risks                            and benefits of the procedure and the sedation                            options and risks were discussed with the patient.                            All questions were answered, and informed consent                            was obtained. Prior Anticoagulants: The patient has                            taken no previous anticoagulant or antiplatelet                            agents. ASA Grade Assessment: II - A patient with                            mild systemic disease. After reviewing the risks                            and benefits, the patient was deemed in                            satisfactory condition to undergo the procedure.  After obtaining informed consent, the colonoscope                            was passed under direct vision. Throughout the                            procedure, the patient's blood pressure, pulse, and                            oxygen saturations were monitored continuously. The                            CF [id number] #[id number] was introduced through the anus                            and advanced to the  the cecum, identified by                            appendiceal orifice and ileocecal valve. The                            colonoscopy was performed without difficulty. The                            patient tolerated the procedure well. The quality                            of the bowel preparation was excellent. The                            ileocecal valve, appendiceal orifice, and rectum                            were photographed. The bowel preparation used was                            SUPREP. Scope In: 9:23:23 AM Scope Out: 9:37:22 AM Scope Withdrawal Time: 0 hours 11 minutes 6 seconds  Total Procedure Duration: 0 hours 13 minutes 59 seconds  Findings:                 The perianal and digital rectal examinations were                            normal.                           Repeat examination of right colon under NBI                            performed.                           Retroflexion in the rectum was not performed due to  anatomy.                           The entire examined colon appeared normal. Complications:            No immediate complications. Estimated Blood Loss:     Estimated blood loss: none. Impression:               - The entire examined colon is normal.                           - No specimens collected. Recommendation:           - Patient has a contact number available for                            emergencies. The signs and symptoms of potential                            delayed complications were discussed with the                            patient. Return to normal activities tomorrow.                            Written discharge instructions were provided to the                            patient.                           - Resume previous diet.                           - Continue present medications.                           - Repeat colonoscopy in 5 years for screening                             purposes. Thayden Lemire L. Myrtie Neither, MD 04/03/2022 9:42:52 AM This report has been signed electronically.

## 2022-04-03 NOTE — Progress Notes (Signed)
Pt's states no medical or surgical changes since previsit or office visit. 

## 2022-04-04 ENCOUNTER — Telehealth: Payer: Self-pay | Admitting: *Deleted

## 2022-04-04 NOTE — Telephone Encounter (Signed)
Patient returned your call, states her arm turned blue and purple. Requesting a call back as soon as possible.

## 2022-04-04 NOTE — Telephone Encounter (Signed)
Post procedure follow up call placed, no answer and VM was full.

## 2022-04-04 NOTE — Telephone Encounter (Signed)
Spoke with pt. States her right anterior forearm where the IV was is bruised and swollen and hard. States it was a small dime sized bruise noted when the IV was removed before she left [facility]. States the area has gotten larger and is now about the size of a a silver dollar coin. States the area is tender to touch and she can feel it when she moves her arm too. States she has been using ice on the site. RN instructed pt to try heat or a warm compress to the area. Informed pt that MD would be made aware and if there are any further instructions RN will call pt back. RN instructed pt that if the area continues to get larger or more painful that she should seek medical attention from her PCP or an Urgent Care. Pt verbalized understanding.

## 2022-04-08 NOTE — Telephone Encounter (Signed)
Thanks for the note, and I agree with your recommendations.  Hematoma that I expect to resolve.  - HD

## 2024-02-13 ENCOUNTER — Encounter (HOSPITAL_COMMUNITY): Payer: Self-pay

## 2024-02-13 ENCOUNTER — Emergency Department (HOSPITAL_COMMUNITY)
Admission: EM | Admit: 2024-02-13 | Discharge: 2024-02-13 | Disposition: A | Discharge status: Home / Self Care | Attending: Emergency Medicine | Admitting: Emergency Medicine

## 2024-02-13 ENCOUNTER — Emergency Department (HOSPITAL_COMMUNITY)

## 2024-02-13 DIAGNOSIS — W232XXA Caught, crushed, jammed or pinched between a moving and stationary object, initial encounter: Secondary | ICD-10-CM | POA: Insufficient documentation

## 2024-02-13 DIAGNOSIS — S92535A Nondisplaced fracture of distal phalanx of left lesser toe(s), initial encounter for closed fracture: Secondary | ICD-10-CM | POA: Insufficient documentation

## 2024-02-13 DIAGNOSIS — M79675 Pain in left toe(s): Secondary | ICD-10-CM | POA: Diagnosis present

## 2024-02-13 DIAGNOSIS — M7732 Calcaneal spur, left foot: Secondary | ICD-10-CM | POA: Diagnosis not present

## 2024-02-13 MED ORDER — ACETAMINOPHEN 325 MG PO TABS
650.0000 mg | ORAL_TABLET | Freq: Once | ORAL | Status: AC
  Administered 2024-02-13: 650 mg via ORAL
  Filled 2024-02-13: qty 2

## 2024-02-13 MED ORDER — KETOROLAC TROMETHAMINE 15 MG/ML IJ SOLN
15.0000 mg | Freq: Once | INTRAMUSCULAR | Status: DC
  Filled 2024-02-13: qty 1

## 2024-02-13 NOTE — ED Triage Notes (Signed)
 Pt arrived reporting left foot, toe pain. States hit toe on her dresser, red, swollen and painful. Did not take any meds. No other complaints

## 2024-02-13 NOTE — ED Provider Notes (Signed)
 [place] EMERGENCY DEPARTMENT AT [facility] Provider Note   CSN: [id number] Arrival date & time: 02/13/24  1259     Patient presents with: Toe Pain and Toe Injury   Suzanne Campbell is a 38 y.o. female with overall noncontributory past medical history presents with concern for left foot, fourth toe pain.  She reports that she stubbed the toe on her dresser.  Nothing for pain prior to arrival.  Reports it feels like it is throbbing.    Toe Pain       Prior to Admission medications   Medication Sig Start Date End Date Taking? Authorizing Provider  fluticasone  (FLONASE ) 50 MCG/ACT nasal spray Place 2 sprays into both nostrils daily. Patient not taking: Reported on 03/06/2022 06/25/21   Joesph Shaver Scales, PA-C  ipratropium (ATROVENT ) 0.06 % nasal spray Place 2 sprays into both nostrils 4 (four) times daily. As needed for nasal congestion, runny nose Patient not taking: Reported on 03/06/2022 06/25/21   Joesph Shaver Scales, PA-C  promethazine -dextromethorphan (PROMETHAZINE -DM) 6.25-15 MG/5ML syrup Take 5 mLs by mouth 3 (three) times daily as needed for cough. Patient not taking: Reported on 03/06/2022 06/25/21   Joesph Shaver Scales, PA-C    Allergies: Penicillins    Review of Systems  All other systems reviewed and are negative.   Updated Vital Signs BP (!) 163/99 (BP Location: Left Arm)   Pulse 97   Temp 98 F (36.7 C) (Oral)   Resp 16   Ht 5' 3 (1.6 m)   Wt 90.3 kg   SpO2 100%   BMI 35.26 kg/m   Physical Exam Vitals and nursing note reviewed.  Constitutional:      General: She is not in acute distress.    Appearance: Normal appearance.  HENT:     Head: Normocephalic and atraumatic.  Eyes:     General:        Right eye: No discharge.        Left eye: No discharge.  Cardiovascular:     Rate and Rhythm: Normal rate and regular rhythm.  Pulmonary:     Effort: Pulmonary effort is normal. No respiratory distress.  Musculoskeletal:         General: No deformity.     Comments: Redness, tenderness, swelling noted distal aspect of left fourth toe. No obvious stepoff, deformity. The rest of the foot is atraumatic in appearance.  Skin:    General: Skin is warm and dry.  Neurological:     Mental Status: She is alert and oriented to person, place, and time.  Psychiatric:        Mood and Affect: Mood normal.        Behavior: Behavior normal.     (all labs ordered are listed, but only abnormal results are displayed) Labs Reviewed - No data to display  EKG: None  Radiology: DG Foot Complete Left Result Date: 02/13/2024 CLINICAL DATA:  Injured left fourth toe this morning. EXAM: LEFT FOOT - COMPLETE 3+ VIEW COMPARISON:  None Available. FINDINGS: The joint spaces are maintained. On the oblique film there is a small nondisplaced fracture involving the medial aspect of the distal phalanx of the fourth toe. No other bony abnormalities are identified. Incidental small calcaneal heel spur. IMPRESSION: Small nondisplaced fracture involving the medial aspect of the distal phalanx of the fourth toe. Electronically Signed   By: MYRTIS Stammer M.D.   On: 02/13/2024 13:51     Procedures   Medications Ordered in the ED  acetaminophen  (TYLENOL ) tablet 650 mg (has no administration in time range)                                    Medical Decision Making Amount and/or Complexity of Data Reviewed Radiology: ordered.   This patient is a 38 y.o. female who presents to the ED for concern of foot pain.   Differential diagnoses prior to evaluation: Fracture, dislocation, vs other  Past Medical History / Social History / Additional history: Chart reviewed. Pertinent results include: overall noncontributory  Physical Exam: Physical exam performed. The pertinent findings include: Redness, tenderness, swelling noted distal aspect of left fourth toe. No obvious stepoff, deformity. The rest of the foot is atraumatic in appearance.   I  independently interpreted imaging including plain film xray of left foot which shows distal 4th phalanx nondisplaced fracture. I agree with the radiologist interpretation.  Medications / Treatment: Tylenol  for pain, encouraged, RICE, ibuprofen and tylenol  for pain   Disposition: After consideration of the diagnostic results and the patients response to treatment, I feel that patient stable for discharge with plan as above .   emergency department workup does not suggest an emergent condition requiring admission or immediate intervention beyond what has been performed at this time. The plan is: as above. The patient is safe for discharge and has been instructed to return immediately for worsening symptoms, change in symptoms or any other concerns.   Final diagnoses:  None    ED Discharge Orders     None          Rosan Sherlean VEAR DEVONNA 02/13/24 1407    Laurice Maude BROCKS, MD 02/13/24 [phone]

## 2024-02-13 NOTE — Discharge Instructions (Signed)
 Please use Tylenol  or ibuprofen for pain.  You may use 600 mg ibuprofen every 6 hours or 1000 mg of Tylenol  every 6 hours.  You may choose to alternate between the 2.  This would be most effective.  Not to exceed 4 g of Tylenol  within 24 hours.  Not to exceed 3200 mg ibuprofen 24 hours.  You can use the crutches as needed for comfort, I recommend using ice, keeping the area elevated to help with healing, pain, swelling over the next few days.
